# Patient Record
Sex: Female | Born: 1975
Health system: Southern US, Community
[De-identification: ages and names within clinical notes are randomized; demographics above are authoritative.]

## PROBLEM LIST (undated history)

## (undated) DIAGNOSIS — F329 Major depressive disorder, single episode, unspecified: Secondary | ICD-10-CM

## (undated) DIAGNOSIS — F419 Anxiety disorder, unspecified: Secondary | ICD-10-CM

## (undated) DIAGNOSIS — R000 Tachycardia, unspecified: Secondary | ICD-10-CM

## (undated) DIAGNOSIS — N200 Calculus of kidney: Secondary | ICD-10-CM

## (undated) DIAGNOSIS — F32A Depression, unspecified: Secondary | ICD-10-CM

## (undated) HISTORY — DX: Major depressive disorder, single episode, unspecified: F32.9

## (undated) HISTORY — DX: Depression, unspecified: F32.A

## (undated) HISTORY — DX: Anxiety disorder, unspecified: F41.9

## (undated) HISTORY — DX: Tachycardia, unspecified: R00.0

## (undated) HISTORY — DX: Calculus of kidney: N20.0

---

## 2018-02-15 DIAGNOSIS — R05 Cough: Secondary | ICD-10-CM | POA: Diagnosis not present

## 2018-02-15 DIAGNOSIS — J309 Allergic rhinitis, unspecified: Secondary | ICD-10-CM | POA: Diagnosis not present

## 2018-02-19 DIAGNOSIS — J029 Acute pharyngitis, unspecified: Secondary | ICD-10-CM | POA: Diagnosis not present

## 2018-06-07 DIAGNOSIS — Z9109 Other allergy status, other than to drugs and biological substances: Secondary | ICD-10-CM | POA: Diagnosis not present

## 2018-06-07 DIAGNOSIS — E7800 Pure hypercholesterolemia, unspecified: Secondary | ICD-10-CM | POA: Diagnosis not present

## 2018-06-07 DIAGNOSIS — E559 Vitamin D deficiency, unspecified: Secondary | ICD-10-CM | POA: Diagnosis not present

## 2018-06-07 DIAGNOSIS — R5383 Other fatigue: Secondary | ICD-10-CM | POA: Diagnosis not present

## 2018-06-07 DIAGNOSIS — R635 Abnormal weight gain: Secondary | ICD-10-CM | POA: Diagnosis not present

## 2018-07-04 DIAGNOSIS — L709 Acne, unspecified: Secondary | ICD-10-CM | POA: Diagnosis not present

## 2018-07-04 DIAGNOSIS — R635 Abnormal weight gain: Secondary | ICD-10-CM | POA: Diagnosis not present

## 2018-07-04 DIAGNOSIS — R5383 Other fatigue: Secondary | ICD-10-CM | POA: Diagnosis not present

## 2018-07-04 DIAGNOSIS — E7800 Pure hypercholesterolemia, unspecified: Secondary | ICD-10-CM | POA: Diagnosis not present

## 2018-08-11 DIAGNOSIS — F329 Major depressive disorder, single episode, unspecified: Secondary | ICD-10-CM | POA: Diagnosis not present

## 2018-08-11 DIAGNOSIS — Z23 Encounter for immunization: Secondary | ICD-10-CM | POA: Diagnosis not present

## 2018-08-11 DIAGNOSIS — R251 Tremor, unspecified: Secondary | ICD-10-CM | POA: Diagnosis not present

## 2018-08-28 ENCOUNTER — Other Ambulatory Visit: Payer: Self-pay | Admitting: Family Medicine

## 2018-08-28 DIAGNOSIS — Z1231 Encounter for screening mammogram for malignant neoplasm of breast: Secondary | ICD-10-CM

## 2018-09-12 DIAGNOSIS — R251 Tremor, unspecified: Secondary | ICD-10-CM | POA: Diagnosis not present

## 2018-09-12 DIAGNOSIS — F329 Major depressive disorder, single episode, unspecified: Secondary | ICD-10-CM | POA: Diagnosis not present

## 2018-09-20 DIAGNOSIS — F411 Generalized anxiety disorder: Secondary | ICD-10-CM | POA: Diagnosis not present

## 2018-09-27 ENCOUNTER — Ambulatory Visit
Admission: RE | Admit: 2018-09-27 | Discharge: 2018-09-27 | Disposition: A | Discharge status: Home / Self Care | Payer: BLUE CROSS/BLUE SHIELD | Source: Ambulatory Visit | Attending: Family Medicine | Admitting: Family Medicine

## 2018-09-27 DIAGNOSIS — Z1231 Encounter for screening mammogram for malignant neoplasm of breast: Secondary | ICD-10-CM

## 2018-09-28 ENCOUNTER — Other Ambulatory Visit: Payer: Self-pay | Admitting: Family Medicine

## 2018-09-28 DIAGNOSIS — N63 Unspecified lump in unspecified breast: Secondary | ICD-10-CM

## 2018-10-03 DIAGNOSIS — B349 Viral infection, unspecified: Secondary | ICD-10-CM | POA: Diagnosis not present

## 2018-10-03 DIAGNOSIS — J111 Influenza due to unidentified influenza virus with other respiratory manifestations: Secondary | ICD-10-CM | POA: Diagnosis not present

## 2018-10-03 DIAGNOSIS — R509 Fever, unspecified: Secondary | ICD-10-CM | POA: Diagnosis not present

## 2018-10-03 DIAGNOSIS — R6889 Other general symptoms and signs: Secondary | ICD-10-CM | POA: Diagnosis not present

## 2018-10-10 ENCOUNTER — Ambulatory Visit
Admission: RE | Admit: 2018-10-10 | Discharge: 2018-10-10 | Disposition: A | Discharge status: Home / Self Care | Payer: BLUE CROSS/BLUE SHIELD | Source: Ambulatory Visit | Attending: Family Medicine | Admitting: Family Medicine

## 2018-10-10 ENCOUNTER — Other Ambulatory Visit: Payer: Self-pay | Admitting: Family Medicine

## 2018-10-10 DIAGNOSIS — N63 Unspecified lump in unspecified breast: Secondary | ICD-10-CM

## 2018-10-10 DIAGNOSIS — R922 Inconclusive mammogram: Secondary | ICD-10-CM | POA: Diagnosis not present

## 2018-10-10 DIAGNOSIS — N6489 Other specified disorders of breast: Secondary | ICD-10-CM | POA: Diagnosis not present

## 2018-10-11 DIAGNOSIS — F411 Generalized anxiety disorder: Secondary | ICD-10-CM | POA: Diagnosis not present

## 2018-10-24 ENCOUNTER — Ambulatory Visit: Payer: BLUE CROSS/BLUE SHIELD | Admitting: Neurology

## 2019-02-08 DIAGNOSIS — Z0000 Encounter for general adult medical examination without abnormal findings: Secondary | ICD-10-CM | POA: Diagnosis not present

## 2019-03-01 ENCOUNTER — Other Ambulatory Visit: Payer: Self-pay

## 2019-03-01 ENCOUNTER — Emergency Department (HOSPITAL_COMMUNITY): Payer: BC Managed Care – PPO

## 2019-03-01 ENCOUNTER — Encounter (HOSPITAL_COMMUNITY): Payer: Self-pay | Admitting: Emergency Medicine

## 2019-03-01 ENCOUNTER — Emergency Department (HOSPITAL_COMMUNITY)
Admission: EM | Admit: 2019-03-01 | Discharge: 2019-03-01 | Disposition: A | Discharge status: Home / Self Care | Payer: BC Managed Care – PPO | Attending: Emergency Medicine | Admitting: Emergency Medicine

## 2019-03-01 DIAGNOSIS — Z79899 Other long term (current) drug therapy: Secondary | ICD-10-CM | POA: Diagnosis not present

## 2019-03-01 DIAGNOSIS — R55 Syncope and collapse: Secondary | ICD-10-CM | POA: Diagnosis not present

## 2019-03-01 DIAGNOSIS — Z20828 Contact with and (suspected) exposure to other viral communicable diseases: Secondary | ICD-10-CM | POA: Diagnosis not present

## 2019-03-01 DIAGNOSIS — R002 Palpitations: Secondary | ICD-10-CM | POA: Insufficient documentation

## 2019-03-01 DIAGNOSIS — R0602 Shortness of breath: Secondary | ICD-10-CM | POA: Diagnosis not present

## 2019-03-01 DIAGNOSIS — R000 Tachycardia, unspecified: Secondary | ICD-10-CM | POA: Diagnosis not present

## 2019-03-01 LAB — URINALYSIS, ROUTINE W REFLEX MICROSCOPIC
Bilirubin Urine: NEGATIVE
Glucose, UA: NEGATIVE mg/dL
Ketones, ur: NEGATIVE mg/dL
Nitrite: NEGATIVE
Protein, ur: 100 mg/dL — AB
Specific Gravity, Urine: 1.023 (ref 1.005–1.030)
pH: 6 (ref 5.0–8.0)

## 2019-03-01 LAB — BASIC METABOLIC PANEL
Anion gap: 11 (ref 5–15)
BUN: 12 mg/dL (ref 6–20)
CO2: 23 mmol/L (ref 22–32)
Calcium: 9 mg/dL (ref 8.9–10.3)
Chloride: 102 mmol/L (ref 98–111)
Creatinine, Ser: 0.72 mg/dL (ref 0.44–1.00)
GFR calc Af Amer: >60 mL/min (ref >60)
GFR calc non Af Amer: >60 mL/min (ref >60)
Glucose, Bld: 196 mg/dL — ABNORMAL HIGH (ref 70–99)
Potassium: 3.5 mmol/L (ref 3.5–5.1)
Sodium: 136 mmol/L (ref 135–145)

## 2019-03-01 LAB — CBC
HCT: 39.1 % (ref 36.0–46.0)
Hemoglobin: 13 g/dL (ref 12.0–15.0)
MCH: 29.7 pg (ref 26.0–34.0)
MCHC: 33.2 g/dL (ref 30.0–36.0)
MCV: 89.5 fL (ref 80.0–100.0)
Platelets: 261 10*3/uL (ref 150–400)
RBC: 4.37 MIL/uL (ref 3.87–5.11)
RDW: 13.1 % (ref 11.5–15.5)
WBC: 10.6 10*3/uL — ABNORMAL HIGH (ref 4.0–10.5)
nRBC: 0 % (ref 0.0–0.2)

## 2019-03-01 LAB — I-STAT BETA HCG BLOOD, ED (MC, WL, AP ONLY): I-stat hCG, quantitative: <5 m[IU]/mL (ref <5)

## 2019-03-01 LAB — CBG MONITORING, ED: Glucose-Capillary: 139 mg/dL — ABNORMAL HIGH (ref 70–99)

## 2019-03-01 LAB — TSH: TSH: 8.58 u[IU]/mL — ABNORMAL HIGH (ref 0.350–4.500)

## 2019-03-01 LAB — D-DIMER, QUANTITATIVE: D-Dimer, Quant: 0.68 ug/mL-FEU — ABNORMAL HIGH (ref 0.00–0.50)

## 2019-03-01 MED ORDER — ALPRAZOLAM 0.25 MG PO TABS
1.0000 mg | ORAL_TABLET | Freq: Once | ORAL | Status: AC
  Administered 2019-03-01: 1 mg via ORAL
  Filled 2019-03-01: qty 4

## 2019-03-01 MED ORDER — SODIUM CHLORIDE 0.9 % IV BOLUS
1000.0000 mL | Freq: Once | INTRAVENOUS | Status: AC
  Administered 2019-03-01: 06:00:00 1000 mL via INTRAVENOUS

## 2019-03-01 MED ORDER — SODIUM CHLORIDE 0.9% FLUSH
3.0000 mL | Freq: Once | INTRAVENOUS | Status: AC
  Administered 2019-03-01: 3 mL via INTRAVENOUS

## 2019-03-01 MED ORDER — IOHEXOL 350 MG/ML SOLN
80.0000 mL | Freq: Once | INTRAVENOUS | Status: AC | PRN
  Administered 2019-03-01: 80 mL via INTRAVENOUS

## 2019-03-01 MED ORDER — LORAZEPAM 0.5 MG PO TABS: 0.5000 mg | ORAL_TABLET | Freq: Three times a day (TID) | ORAL | 0 refills | Status: DC | PRN

## 2019-03-01 NOTE — ED Notes (Signed)
DG bedside

## 2019-03-01 NOTE — Discharge Instructions (Signed)
Please return to the ED if symptoms worsen, there is further passing out or for new concern.

## 2019-03-01 NOTE — ED Notes (Signed)
Patient transported to CT 

## 2019-03-01 NOTE — ED Triage Notes (Signed)
Pt reports that she feels palpations tonight and reports that her husband witnessed two separate occasions of "passing out".  Pt reports chills and feeling "different."

## 2019-03-01 NOTE — ED Provider Notes (Signed)
[place] EMERGENCY DEPARTMENT Provider Note   CSN: [id number] Arrival date & time: 03/01/19  0235     History   Chief Complaint Chief Complaint  Patient presents with  . Loss of Consciousness    HPI Jeanne Fitzpatrick is a 43 y.o. female.     Patient to ED with complaint of palpitations that started one week ago and have progressively gotten worse. No chest pain, fever or cough. Episodes started as lasting minutes and now last for hours. She feels like she has to make herself take deep breaths when her heart is racing. No nausea or vomiting. Today, she had 2 syncopal episodes associated with the palpitations. No significant weight loss, hot/cold intolerance, skin changes. No pain and/or swelling of LE's. No history of recent travel or clotting disorders.   The history is provided by the patient. No language interpreter was used.  Loss of Consciousness Associated symptoms: palpitations and shortness of breath   Associated symptoms: no chest pain, no diaphoresis, no fever, no headaches, no nausea, no vomiting and no weakness     Past Medical History:  Diagnosis Date  . Depression     There are no active problems to display for this patient.   Past Surgical History:  Procedure Laterality Date  . CESAREAN SECTION       OB History   No obstetric history on file.      Home Medications    Prior to Admission medications   Medication Sig Start Date End Date Taking? Authorizing Provider  buPROPion (WELLBUTRIN XL) 150 MG 24 hr tablet Take 150 mg by mouth daily.    [provider]    Family History No family history on file.  Social History Social History   Tobacco Use  . Smoking status: Not on file  Substance Use Topics  . Alcohol use: Not on file  . Drug use: Not on file     Allergies   Patient has no allergy information on record.   Review of Systems Review of Systems  Constitutional: Negative for chills, diaphoresis, fever and  unexpected weight change.  HENT: Negative.   Respiratory: Positive for chest tightness and shortness of breath. Negative for cough.   Cardiovascular: Positive for palpitations and syncope. Negative for chest pain and leg swelling.  Gastrointestinal: Negative.  Negative for abdominal pain, nausea and vomiting.  Musculoskeletal: Negative.  Negative for myalgias.  Skin: Negative.   Neurological: Positive for syncope. Negative for weakness, numbness and headaches.     Physical Exam Updated Vital Signs BP (!) 144/84   Pulse (!) 121   Temp 97.6 F (36.4 C) (Oral)   Resp 17   SpO2 100%   Physical Exam Vitals signs and nursing note reviewed.  Constitutional:      Appearance: She is well-developed. She is not ill-appearing or diaphoretic.     Comments: Anxious appearing.   HENT:     Head: Normocephalic.     Mouth/Throat:     Mouth: Mucous membranes are moist.  Eyes:     Pupils: Pupils are equal, round, and reactive to light.  Neck:     Musculoskeletal: Normal range of motion and neck supple.  Cardiovascular:     Rate and Rhythm: Regular rhythm. Tachycardia present.     Heart sounds: No murmur.  Pulmonary:     Effort: Pulmonary effort is normal.     Breath sounds: Normal breath sounds. No wheezing, rhonchi or rales.  Chest:     Chest  wall: No tenderness.  Abdominal:     General: Bowel sounds are normal.     Palpations: Abdomen is soft.     Tenderness: There is no abdominal tenderness. There is no guarding or rebound.  Musculoskeletal: Normal range of motion.        General: No swelling or tenderness.  Skin:    General: Skin is warm and dry.  Neurological:     General: No focal deficit present.     Mental Status: She is alert and oriented to person, place, and time.      ED Treatments / Results  Labs (all labs ordered are listed, but only abnormal results are displayed) Labs Reviewed  BASIC METABOLIC PANEL - Abnormal; Notable for the following components:      Result  Value   Glucose, Bld 196 (*)    All other components within normal limits  CBC - Abnormal; Notable for the following components:   WBC 10.6 (*)    All other components within normal limits  URINALYSIS, ROUTINE W REFLEX MICROSCOPIC - Abnormal; Notable for the following components:   Color, Urine AMBER (*)    APPearance CLOUDY (*)    Hgb urine dipstick LARGE (*)    Protein, ur 100 (*)    Leukocytes,Ua TRACE (*)    Bacteria, UA RARE (*)    All other components within normal limits  D-DIMER, QUANTITATIVE (NOT AT [facility]) - Abnormal; Notable for the following components:   D-Dimer, Quant 0.68 (*)    All other components within normal limits  CBG MONITORING, ED - Abnormal; Notable for the following components:   Glucose-Capillary 139 (*)    All other components within normal limits  TSH  I-STAT BETA HCG BLOOD, ED ([facility], [facility], [facility] ONLY)    EKG EKG Interpretation  Date/Time:  Thursday March 01 2019 02:49:49 EDT Ventricular Rate:  113 PR Interval:  176 QRS Duration: 72 QT Interval:  298 QTC Calculation: 408 R Axis:   72 Text Interpretation:  Sinus tachycardia Right atrial enlargement T wave abnormality, consider inferior ischemia Abnormal ECG No old tracing to compare suspect long QT Confirmed by Marily MemosMesner, Jason [phone]([id number]) on 03/01/2019 3:34:50 AM   Radiology Dg Chest Portable 1 View  Result Date: 03/01/2019 CLINICAL DATA:  Shortness of breath EXAM: PORTABLE CHEST 1 VIEW COMPARISON:  None. FINDINGS: Heart and mediastinal contours are within normal limits. No focal opacities or effusions. No acute bony abnormality. IMPRESSION: No active disease. Electronically Signed   By: Charlett NoseKevin  Dover M.D.   On: 03/01/2019 04:00    Procedures Procedures (including critical care time)  Medications Ordered in ED Medications  sodium chloride flush (NS) 0.9 % injection 3 mL (has no administration in time range)  ALPRAZolam (XANAX) tablet 1 mg (1 mg Oral Given 03/01/19 0414)     Initial Impression / Assessment  and Plan / ED Course  I have reviewed the triage vital signs and the nursing notes.  Pertinent labs & imaging results that were available during my care of the patient were reviewed by me and considered in my medical decision making (see chart for details).        Patient to ED having progressively worsening 'heart racing' palpitations x 1 week without previous history. No fever, cough, vomiting, chest pain. She reports 2 episodes of syncope today.   Patient's heartrate persistently elevated to 130's. EKG shows sinus tachycardia. Labs are essentially WNL. Mildly elevated TSH which does not c/w tachycardia. Elevated d-dimer with CTA negative for PE.  DDx includes anxiety, however, episodes are increasing in duration and include syncope. Cardiogenic cause will need to be evaluated - will provide referral to cardiology.   IVF's provided. There is some improvement in heart rate to 108. She is feeling better. Has had Xanax here so will provide Rx for Ativan at home #8 only.   The patient has expressed concerns for COVID-19 as a possible cause of her symptoms. She reports a positive contact exposed to husband. Will obtain send out test to follow up outpatient.   Return precautions discussed. She states she is comfortable going home and is felt reliable to outpatient follow up.     Final Clinical Impressions(s) / ED Diagnoses   Final diagnoses:  None   1. Palpitations.  ED Discharge Orders    None       Elpidio AnisUpstill, Nixon Sparr, Cordelia Poche-C 03/01/19 [id number]    Mesner, Barbara CowerJason, MD 03/01/19 [phone]

## 2019-03-02 LAB — NOVEL CORONAVIRUS, NAA (HOSP ORDER, SEND-OUT TO REF LAB; TAT 18-24 HRS): SARS-CoV-2, NAA: NOT DETECTED

## 2019-03-05 DIAGNOSIS — F411 Generalized anxiety disorder: Secondary | ICD-10-CM | POA: Diagnosis not present

## 2019-03-05 DIAGNOSIS — R002 Palpitations: Secondary | ICD-10-CM | POA: Diagnosis not present

## 2019-03-05 DIAGNOSIS — R55 Syncope and collapse: Secondary | ICD-10-CM | POA: Diagnosis not present

## 2019-03-05 DIAGNOSIS — R946 Abnormal results of thyroid function studies: Secondary | ICD-10-CM | POA: Diagnosis not present

## 2019-03-06 ENCOUNTER — Encounter: Payer: Self-pay | Admitting: Cardiology

## 2019-03-06 ENCOUNTER — Ambulatory Visit (INDEPENDENT_AMBULATORY_CARE_PROVIDER_SITE_OTHER): Payer: BC Managed Care – PPO | Admitting: Cardiology

## 2019-03-06 ENCOUNTER — Other Ambulatory Visit: Payer: Self-pay

## 2019-03-06 VITALS — BP 130/70 | HR 114 | Ht 63.0 in | Wt 140.4 lb

## 2019-03-06 DIAGNOSIS — R002 Palpitations: Secondary | ICD-10-CM

## 2019-03-06 DIAGNOSIS — R55 Syncope and collapse: Secondary | ICD-10-CM

## 2019-03-06 DIAGNOSIS — R0602 Shortness of breath: Secondary | ICD-10-CM | POA: Diagnosis not present

## 2019-03-06 HISTORY — DX: Palpitations: R00.2

## 2019-03-06 HISTORY — DX: Shortness of breath: R06.02

## 2019-03-06 HISTORY — DX: Syncope and collapse: R55

## 2019-03-06 NOTE — Progress Notes (Signed)
Cardiology Office Note:    Date:  03/06/2019   ID:  Jeanne Fitzpatrick, DOB 11-12-1975, MRN [id number]  PCP:  Farris HasMorrow, Aaron, MD  Cardiologist:  No primary care provider on file.  Electrophysiologist:  None   Referring MD: Farris HasMorrow, Aaron, MD   Chief Complaint  Patient presents with  . Hospitalization Follow-up  43 yo female presents to establish cardiac care and follow up after recent ED visit for palpitations and syncopal episodes.   History of Present Illness:    Jeanne Fitzpatrick is a 43 y.o. female with a hx of depression who presents today for follow up after being [facility] 03[facility]01/[facility] for palpitations, syncope, chest tightness, and shortness of breath. During her ED visit, CTA that was negative for PE after d-dimer of 0.68, negative COVID test, normal chest x-ray. Noted TSH of 8.58. She was given IVF with some improvement in tachycardia. EKG with sinus tachycardia without acute ST/T wave changes. She was discharged with a prescription for 6 tablets of Ativan 0.5mg  as Xanax in the ED helped her symptoms.   Tells me her palpitations started about a week ago and she has had 4-5 episodes.  The episode that took her to the emergency department occurred suddenly in the middle of the night and she reports that her heart just "did not feel quite right".  She went to the restroom feeling that she needed to have a bowel movement and got very sweaty and passed out twice per her husband's report.  She said the first time she fell to the floor from a sitting position and the second time she merely "slumped over".  She has had no episodes of syncope since.  Tells me she was passed out for only a couple of seconds.  Palpitations are associated with shortness of breath.  She was given a prescription for Ativan in the emergency department, tells me she took it once before bed and felt that she slept better, but was not sure why she was given it.  She reports no excessive caffeine use, was drinking 2 Cokes  per day up until about 2 months ago when she started watching her diet and exercising regularly.  She was using the elliptical 30 minutes a day 4-5 times per week.  Since these episodes have started she has been hesitant to get back on the elliptical.  She takes no over-the-counter proarrhythmic medications.    Reports no family history of sudden cardiac death.  Tells me her father has arrhythmia and has had some sort of heart surgery.  Tells me her paternal grandparents both had MI and her paternal grandfather died while undergoing heart surgery.  All of these procedures were in [place] where she is from and she is not sure of the names of the procedures in [place].  She currently resides with her husband and daughter.  She denies chest pain, edema.  Past Medical History:  Diagnosis Date  . Depression     Past Surgical History:  Procedure Laterality Date  . CESAREAN SECTION      Current Medications: Current Meds  Medication Sig  . cholecalciferol (VITAMIN D3) 25 MCG (1000 UT) tablet Take 2,000 Units by mouth daily.  Marland Kitchen. escitalopram (LEXAPRO) 5 MG tablet Take 5 mg by mouth daily.  Marland Kitchen. LORazepam (ATIVAN) 0.5 MG tablet Take 1 tablet (0.5 mg total) by mouth every 8 (eight) hours as needed for anxiety.  . vitamin C (ASCORBIC ACID) 500 MG tablet Take 1,000 mg by mouth  daily.     Allergies:   Patient has no known allergies.   Social History   Socioeconomic History  . Marital status: Married    Spouse name: Not on file  . Number of children: Not on file  . Years of education: Not on file  . Highest education level: Not on file  Occupational History  . Not on file  Social Needs  . Financial resource strain: Not on file  . Food insecurity    Worry: Not on file    Inability: Not on file  . Transportation needs    Medical: Not on file    Non-medical: Not on file  Tobacco Use  . Smoking status: Never Smoker  . Smokeless tobacco: Never Used  Substance and Sexual Activity  . Alcohol  use: Never    Frequency: Never  . Drug use: Never  . Sexual activity: Not on file  Lifestyle  . Physical activity    Days per week: Not on file    Minutes per session: Not on file  . Stress: Not on file  Relationships  . Social Musicianconnections    Talks on phone: Not on file    Gets together: Not on file    Attends religious service: Not on file    Active member of club or organization: Not on file    Attends meetings of clubs or organizations: Not on file    Relationship status: Not on file  Other Topics Concern  . Not on file  Social History Narrative  . Not on file     Family History: The patient's father with arrhythmia and reported heart surgery.  Paternal grandfather with report of MI and death during cardiac surgery.  Paternal grandmother with report of MI.  ROS:   Please see the history of present illness.    Review of Systems  Constitution: Positive for diaphoresis (during syncopal episode). Negative for chills, fever and malaise/fatigue.  Cardiovascular: Positive for irregular heartbeat, palpitations and syncope (episodes prior to ED visit, none since). Negative for chest pain, dyspnea on exertion and leg swelling.  Respiratory: Positive for shortness of breath (with palpitations). Negative for cough and wheezing.   Gastrointestinal: Negative for nausea and vomiting.  Neurological: Negative for dizziness, light-headedness and weakness.    All other systems reviewed and are negative.  EKGs/Labs/Other Studies Reviewed:    The following studies were reviewed today: EKG from emergency department shows sinus tachycardia.  EKG:  EKG is ordered today.  The ekg ordered today demonstrates normal sinus rhythm with possible left atrial enlargement.  No acute ST/T wave changes.  Recent Labs: 03/01/2019: BUN 12; Creatinine, Ser 0.72; Hemoglobin 13.0; Platelets 261; Potassium 3.5; Sodium 136; TSH 8.580  Recent Lipid Panel No results found for: CHOL, TRIG, HDL, CHOLHDL, VLDL,  LDLCALC, LDLDIRECT  Physical Exam:    VS:  BP 130/70   Pulse (!) 114   Ht 5\' 3"  (1.6 m)   Wt 140 lb 6.4 oz (63.7 kg)   SpO2 99%   BMI 24.87 kg/m     Wt Readings from Last 3 Encounters:  03/06/19 140 lb 6.4 oz (63.7 kg)     GEN:  Well nourished, well developed in no acute distress HEENT: Normal NECK: No JVD; No carotid bruits LYMPHATICS: No lymphadenopathy CARDIAC: RRR, no murmurs, rubs, gallops RESPIRATORY:  Clear to auscultation without rales, wheezing or rhonchi  ABDOMEN: Soft, non-tender, non-distended MUSCULOSKELETAL:  No edema; No deformity  SKIN: Warm and dry NEUROLOGIC:  Alert and  oriented x 3 PSYCHIATRIC:  Normal affect   ASSESSMENT:    1. Palpitations   2. Syncope and collapse   3. Shortness of breath    PLAN:    In order of problems listed above:  1. Palpitations - Onset 1 week ago.  Reports her heart "does not feel right".  Seen in ED for episode - EKG at that time with tachycardia - treated with IVF with some improvement. No obvious cause: no excessive caffeine use, non-smoker, no over-the-counter proarrhythmic medications.  Monitors her heart rate by her Apple watch and reports heart rates regularly average in the 80s.  Can utilize EKG function on Apple watch.  Etiology consideration of inappropriate sinus tachycardia, QTC prolongation, anxiety.   TSH was abnormal (8.58) in the emergency department, opposite of what expected in the setting of tachycardia. .  Will repeat TSH with free T3-T4.  Will order 14 ZIO monitor. 2. Syncope -No recurrence since visit to the ED.  2 episodes prior to the emergency department lasted seconds and were consistent with vasovagal syncope as she was on the toilet having a bowel movement and sweating.  She is advised to remain adequately hydrated. 3. SOB -Associated with episodes of palpitations.  Does not occur at any other time.  Denies edema.  Obtain echocardiogram.  Medication Adjustments/Labs and Tests Ordered: Current  medicines are reviewed at length with the patient today.  Concerns regarding medicines are outlined above.  Orders Placed This Encounter  Procedures  . TSH  . T3, free  . T4, free  . LONG TERM MONITOR (3-14 DAYS)  . EKG 12-Lead  . ECHOCARDIOGRAM COMPLETE   No orders of the defined types were placed in this encounter.   Patient Instructions  Medication Instructions:  No medication changes today  If you need a refill on your cardiac medications before your next appointment, please call your pharmacy.   Lab work: TSH, Free T3, T4 for assessment of your thyroid as your numbers in the emergency department were abnormal  If you have labs (blood work) drawn today and your tests are completely normal, you will receive your results only by: Marland Kitchen. MyChart Message (if you have MyChart) OR . A paper copy in the mail If you have any lab test that is abnormal or we need to change your treatment, we will call you to review the results.  Testing/Procedures: Echocardiogram (ultrasound of heart) 14 day ZIO (heart monitor) will be mailed to your home  Follow-Up: At [facility], you and your health needs are our priority.  As part of our continuing mission to provide you with exceptional heart care, we have created designated Provider Care Teams.  These Care Teams include your primary Cardiologist (physician) and Advanced Practice Providers (APPs -  Physician Assistants and Nurse Practitioners) who all work together to provide you with the care you need, when you need it. . You will need a follow up appointment in 4 weeks with Dr. Bing MatterKrasowski    Any Other Special Instructions Will Be Listed Below (If Applicable).  Your syncope sounds like vasovagal syncope. Try to stay well hydrated.   Echocardiogram An echocardiogram is a procedure that uses painless sound waves (ultrasound) to produce an image of the heart. Images from an echocardiogram can provide important information about:  Signs of  coronary artery disease (CAD).  Aneurysm detection. An aneurysm is a weak or damaged part of an artery wall that bulges out from the normal force of blood pumping through the body.  Heart size and shape. Changes in the size or shape of the heart can be associated with certain conditions, including heart failure, aneurysm, and CAD.  Heart muscle function.  Heart valve function.  Signs of a past heart attack.  Fluid buildup around the heart.  Thickening of the heart muscle.  A tumor or infectious growth around the heart valves. Tell a health care provider about:  Any allergies you have.  All medicines you are taking, including vitamins, herbs, eye drops, creams, and over-the-counter medicines.  Any blood disorders you have.  Any surgeries you have had.  Any medical conditions you have.  Whether you are pregnant or may be pregnant. What are the risks? Generally, this is a safe procedure. However, problems may occur, including:  Allergic reaction to dye (contrast) that may be used during the procedure. What happens before the procedure? No specific preparation is needed. You may eat and drink normally. What happens during the procedure?   An IV tube may be inserted into one of your veins.  You may receive contrast through this tube. A contrast is an injection that improves the quality of the pictures from your heart.  A gel will be applied to your chest.  A wand-like tool (transducer) will be moved over your chest. The gel will help to transmit the sound waves from the transducer.  The sound waves will harmlessly bounce off of your heart to allow the heart images to be captured in real-time motion. The images will be recorded on a computer. The procedure may vary among health care providers and hospitals. What happens after the procedure?  You may return to your normal, everyday life, including diet, activities, and medicines, unless your health care provider tells you  not to do that. Summary  An echocardiogram is a procedure that uses painless sound waves (ultrasound) to produce an image of the heart.  Images from an echocardiogram can provide important information about the size and shape of your heart, heart muscle function, heart valve function, and fluid buildup around your heart.  You do not need to do anything to prepare before this procedure. You may eat and drink normally.  After the echocardiogram is completed, you may return to your normal, everyday life, unless your health care provider tells you not to do that. This information is not intended to replace advice given to you by your health care provider. Make sure you discuss any questions you have with your health care provider. Document Released: 08/13/2000 Document Revised: 12/07/2018 Document Reviewed: 09/18/2016 Elsevier Patient Education  2020 [place], Loel Dubonnet, NP  03/06/2019 2:35 PM    [place]

## 2019-03-06 NOTE — Patient Instructions (Addendum)
Medication Instructions:  No medication changes today  If you need a refill on your cardiac medications before your next appointment, please call your pharmacy.   Lab work: TSH, Free T3, T4 for assessment of your thyroid as your numbers in the emergency department were abnormal  If you have labs (blood work) drawn today and your tests are completely normal, you will receive your results only by: Marland Kitchen. MyChart Message (if you have MyChart) OR . A paper copy in the mail If you have any lab test that is abnormal or we need to change your treatment, we will call you to review the results.  Testing/Procedures: Echocardiogram (ultrasound of heart) 14 day ZIO (heart monitor) will be mailed to your home  Follow-Up: At [facility], you and your health needs are our priority.  As part of our continuing mission to provide you with exceptional heart care, we have created designated Provider Care Teams.  These Care Teams include your primary Cardiologist (physician) and Advanced Practice Providers (APPs -  Physician Assistants and Nurse Practitioners) who all work together to provide you with the care you need, when you need it. . You will need a follow up appointment in 4 weeks with Dr. Bing MatterKrasowski    Any Other Special Instructions Will Be Listed Below (If Applicable).  Your syncope sounds like vasovagal syncope. Try to stay well hydrated.   Echocardiogram An echocardiogram is a procedure that uses painless sound waves (ultrasound) to produce an image of the heart. Images from an echocardiogram can provide important information about:  Signs of coronary artery disease (CAD).  Aneurysm detection. An aneurysm is a weak or damaged part of an artery wall that bulges out from the normal force of blood pumping through the body.  Heart size and shape. Changes in the size or shape of the heart can be associated with certain conditions, including heart failure, aneurysm, and CAD.  Heart muscle  function.  Heart valve function.  Signs of a past heart attack.  Fluid buildup around the heart.  Thickening of the heart muscle.  A tumor or infectious growth around the heart valves. Tell a health care provider about:  Any allergies you have.  All medicines you are taking, including vitamins, herbs, eye drops, creams, and over-the-counter medicines.  Any blood disorders you have.  Any surgeries you have had.  Any medical conditions you have.  Whether you are pregnant or may be pregnant. What are the risks? Generally, this is a safe procedure. However, problems may occur, including:  Allergic reaction to dye (contrast) that may be used during the procedure. What happens before the procedure? No specific preparation is needed. You may eat and drink normally. What happens during the procedure?   An IV tube may be inserted into one of your veins.  You may receive contrast through this tube. A contrast is an injection that improves the quality of the pictures from your heart.  A gel will be applied to your chest.  A wand-like tool (transducer) will be moved over your chest. The gel will help to transmit the sound waves from the transducer.  The sound waves will harmlessly bounce off of your heart to allow the heart images to be captured in real-time motion. The images will be recorded on a computer. The procedure may vary among health care providers and hospitals. What happens after the procedure?  You may return to your normal, everyday life, including diet, activities, and medicines, unless your health care provider tells you  not to do that. Summary  An echocardiogram is a procedure that uses painless sound waves (ultrasound) to produce an image of the heart.  Images from an echocardiogram can provide important information about the size and shape of your heart, heart muscle function, heart valve function, and fluid buildup around your heart.  You do not need to do  anything to prepare before this procedure. You may eat and drink normally.  After the echocardiogram is completed, you may return to your normal, everyday life, unless your health care provider tells you not to do that. This information is not intended to replace advice given to you by your health care provider. Make sure you discuss any questions you have with your health care provider. Document Released: 08/13/2000 Document Revised: 12/07/2018 Document Reviewed: 09/18/2016 Elsevier Patient Education  2020 [employer].

## 2019-03-07 LAB — TSH: TSH: 1.61 u[IU]/mL (ref 0.450–4.500)

## 2019-03-07 LAB — T3, FREE: T3, Free: 3 pg/mL (ref 2.0–4.4)

## 2019-03-07 LAB — T4, FREE: Free T4: 1.15 ng/dL (ref 0.82–1.77)

## 2019-03-15 ENCOUNTER — Ambulatory Visit (HOSPITAL_BASED_OUTPATIENT_CLINIC_OR_DEPARTMENT_OTHER)
Admission: RE | Admit: 2019-03-15 | Discharge: 2019-03-15 | Disposition: A | Discharge status: Home / Self Care | Payer: BC Managed Care – PPO | Source: Ambulatory Visit | Attending: Cardiology | Admitting: Cardiology

## 2019-03-15 ENCOUNTER — Other Ambulatory Visit: Payer: Self-pay

## 2019-03-15 DIAGNOSIS — R002 Palpitations: Secondary | ICD-10-CM | POA: Diagnosis not present

## 2019-03-15 DIAGNOSIS — R55 Syncope and collapse: Secondary | ICD-10-CM | POA: Diagnosis not present

## 2019-03-15 DIAGNOSIS — R0602 Shortness of breath: Secondary | ICD-10-CM | POA: Diagnosis not present

## 2019-03-15 NOTE — Progress Notes (Signed)
  Echocardiogram 2D Echocardiogram has been performed.  Jeanne Fitzpatrick 03/15/2019, 9:16 AM

## 2019-03-20 ENCOUNTER — Other Ambulatory Visit (INDEPENDENT_AMBULATORY_CARE_PROVIDER_SITE_OTHER): Payer: BC Managed Care – PPO

## 2019-03-20 DIAGNOSIS — R002 Palpitations: Secondary | ICD-10-CM

## 2019-03-20 DIAGNOSIS — Z0000 Encounter for general adult medical examination without abnormal findings: Secondary | ICD-10-CM | POA: Diagnosis not present

## 2019-03-20 DIAGNOSIS — E559 Vitamin D deficiency, unspecified: Secondary | ICD-10-CM | POA: Diagnosis not present

## 2019-03-20 DIAGNOSIS — R946 Abnormal results of thyroid function studies: Secondary | ICD-10-CM | POA: Diagnosis not present

## 2019-03-20 DIAGNOSIS — R55 Syncope and collapse: Secondary | ICD-10-CM

## 2019-03-20 DIAGNOSIS — Z8249 Family history of ischemic heart disease and other diseases of the circulatory system: Secondary | ICD-10-CM | POA: Diagnosis not present

## 2019-04-03 ENCOUNTER — Ambulatory Visit: Payer: BC Managed Care – PPO | Admitting: Cardiology

## 2019-04-17 ENCOUNTER — Ambulatory Visit (INDEPENDENT_AMBULATORY_CARE_PROVIDER_SITE_OTHER): Payer: BC Managed Care – PPO | Admitting: Cardiology

## 2019-04-17 ENCOUNTER — Encounter: Payer: Self-pay | Admitting: Cardiology

## 2019-04-17 ENCOUNTER — Other Ambulatory Visit: Payer: Self-pay

## 2019-04-17 VITALS — BP 118/84 | HR 97 | Ht 63.0 in | Wt 144.0 lb

## 2019-04-17 DIAGNOSIS — R002 Palpitations: Secondary | ICD-10-CM

## 2019-04-17 DIAGNOSIS — R0602 Shortness of breath: Secondary | ICD-10-CM

## 2019-04-17 DIAGNOSIS — R55 Syncope and collapse: Secondary | ICD-10-CM

## 2019-04-17 MED ORDER — METOPROLOL SUCCINATE ER 25 MG PO TB24: 25.0000 mg | ORAL_TABLET | ORAL | 0 refills | Status: DC | PRN

## 2019-04-17 NOTE — Progress Notes (Signed)
Cardiology Office Note:    Date:  04/17/2019   ID:  Jeanne Fitzpatrick Maudlin, DOB 1975-11-28, MRN [id number]  PCP:  Farris HasMorrow, Aaron, MD  Cardiologist:  Gypsy Balsamobert Krasowski, MD    Referring MD: Farris HasMorrow, Aaron, MD   Chief Complaint  Patient presents with  . Follow-up    zio results  Doing much better  History of Present Illness:    Jeanne Fitzpatrick Knutzen is a 43 y.o. female she was referred to us because of palpitations as well as episode of syncope.  Syncope look like vasovagal.  She was asked to drink plenty of fluids and liberate her salt intake.  Since that time she is doing well.  Denies having any episode of dizziness or syncope.  Another concern was palpitations.  She wore monitor which showed some sinus tachycardia that she pressed trigger for, she also got some narrow complex tachycardia but not much.  She is here today to talk about this overall she is feeling much better.  She did have echocardiogram which showed preserved left ventricular ejection fraction we discussed this as well.  I offered her a small dose of beta-blocker to use on as-needed basis she accepted that offer.  Past Medical History:  Diagnosis Date  . Depression     Past Surgical History:  Procedure Laterality Date  . CESAREAN SECTION      Current Medications: Current Meds  Medication Sig  . cholecalciferol (VITAMIN D3) 25 MCG (1000 UT) tablet Take 2,000 Units by mouth daily.  Marland Kitchen. escitalopram (LEXAPRO) 5 MG tablet Take 5 mg by mouth daily.  . vitamin C (ASCORBIC ACID) 500 MG tablet Take 1,000 mg by mouth daily.     Allergies:   Patient has no known allergies.   Social History   Socioeconomic History  . Marital status: Married    Spouse name: Not on file  . Number of children: Not on file  . Years of education: Not on file  . Highest education level: Not on file  Occupational History  . Not on file  Social Needs  . Financial resource strain: Not on file  . Food insecurity    Worry: Not on file    Inability: Not on  file  . Transportation needs    Medical: Not on file    Non-medical: Not on file  Tobacco Use  . Smoking status: Never Smoker  . Smokeless tobacco: Never Used  Substance and Sexual Activity  . Alcohol use: Never    Frequency: Never  . Drug use: Never  . Sexual activity: Not on file  Lifestyle  . Physical activity    Days per week: Not on file    Minutes per session: Not on file  . Stress: Not on file  Relationships  . Social Musicianconnections    Talks on phone: Not on file    Gets together: Not on file    Attends religious service: Not on file    Active member of club or organization: Not on file    Attends meetings of clubs or organizations: Not on file    Relationship status: Not on file  Other Topics Concern  . Not on file  Social History Narrative  . Not on file     Family History: The patient's family history is not on file. ROS:   Please see the history of present illness.    All 14 point review of systems negative except as described per history of present illness  EKGs/Labs/Other Studies Reviewed:  Recent Labs: 03/01/2019: BUN 12; Creatinine, Ser 0.72; Hemoglobin 13.0; Platelets 261; Potassium 3.5; Sodium 136 03/06/2019: TSH 1.610  Recent Lipid Panel No results found for: CHOL, TRIG, HDL, CHOLHDL, VLDL, LDLCALC, LDLDIRECT  Physical Exam:    VS:  BP 118/84 (BP Location: Right Arm, Patient Position: Sitting)   Pulse 97   Ht 5\' 3"  (1.6 m)   Wt 144 lb (65.3 kg)   SpO2 98%   BMI 25.51 kg/m     Wt Readings from Last 3 Encounters:  04/17/19 144 lb (65.3 kg)  03/06/19 140 lb 6.4 oz (63.7 kg)     GEN:  Well nourished, well developed in no acute distress HEENT: Normal NECK: No JVD; No carotid bruits LYMPHATICS: No lymphadenopathy CARDIAC: RRR, no murmurs, no rubs, no gallops RESPIRATORY:  Clear to auscultation without rales, wheezing or rhonchi  ABDOMEN: Soft, non-tender, non-distended MUSCULOSKELETAL:  No edema; No deformity  SKIN: Warm and dry LOWER  EXTREMITIES: no swelling NEUROLOGIC:  Alert and oriented x 3 PSYCHIATRIC:  Normal affect   ASSESSMENT:    1. Syncope and collapse   2. Shortness of breath   3. Palpitations    PLAN:    In order of problems listed above:  1. Syncope with collapse denies having any look like vasovagal.  Monitor did not show any worrisome arrhythmia.  Echocardiogram showed preserved ejection fraction.  We will continue present management we will give her prescription for 25 mg metoprolol succinate. 2. Shortness of breath normal left ventricular ejection fraction by echocardiogram.  Continue present management. 3. Palpitations denies having any.  Will use beta-blocker to help with her symptoms.      Medication Adjustments/Labs and Tests Ordered: Current medicines are reviewed at length with the patient today.  Concerns regarding medicines are outlined above.  No orders of the defined types were placed in this encounter.  Medication changes: No orders of the defined types were placed in this encounter.   Signed, Park Liter, MD, [facility] 04/17/2019 10:37 AM    [place]

## 2019-04-17 NOTE — Patient Instructions (Signed)
Medication Instructions:  Your physician has recommended you make the following change in your medication:   Take: Metoprolol succinate 25 mg as needed for palpitations.   If you need a refill on your cardiac medications before your next appointment, please call your pharmacy.   Lab work: None.  If you have labs (blood work) drawn today and your tests are completely normal, you will receive your results only by: Marland Kitchen MyChart Message (if you have MyChart) OR . A paper copy in the mail If you have any lab test that is abnormal or we need to change your treatment, we will call you to review the results.  Testing/Procedures: None.   Follow-Up: At [facility], you and your health needs are our priority.  As part of our continuing mission to provide you with exceptional heart care, we have created designated Provider Care Teams.  These Care Teams include your primary Cardiologist (physician) and Advanced Practice Providers (APPs -  Physician Assistants and Nurse Practitioners) who all work together to provide you with the care you need, when you need it. You will need a follow up appointment in 3 months.  Please call our office 2 months in advance to schedule this appointment.  You may see No primary care provider on file. or another member of our [employer] Provider Team in [place]: Shirlee More, MD . Jyl Heinz, MD  Any Other Special Instructions Will Be Listed Below (If Applicable).  Metoprolol extended-release tablets What is this medicine? METOPROLOL (me TOE proe lole) is a beta-blocker. Beta-blockers reduce the workload on the heart and help it to beat more regularly. This medicine is used to treat high blood pressure and to prevent chest pain. It is also used to after a heart attack and to prevent an additional heart attack from occurring. This medicine may be used for other purposes; ask your health care provider or pharmacist if you have questions. COMMON BRAND NAME(S):  toprol, Toprol XL What should I tell my health care provider before I take this medicine? They need to know if you have any of these conditions:  diabetes  heart or vessel disease like slow heart rate, worsening heart failure, heart block, sick sinus syndrome or Raynaud's disease  kidney disease  liver disease  lung or breathing disease, like asthma or emphysema  pheochromocytoma  thyroid disease  an unusual or allergic reaction to metoprolol, other beta-blockers, medicines, foods, dyes, or preservatives  pregnant or trying to get pregnant  breast-feeding How should I use this medicine? Take this medicine by mouth with a glass of water. Follow the directions on the prescription label. Do not crush or chew. Take this medicine with or immediately after meals. Take your doses at regular intervals. Do not take more medicine than directed. Do not stop taking this medicine suddenly. This could lead to serious heart-related effects. Talk to your pediatrician regarding the use of this medicine in children. While this drug may be prescribed for children as young as 6 years for selected conditions, precautions do apply. Overdosage: If you think you have taken too much of this medicine contact a poison control center or emergency room at once. NOTE: This medicine is only for you. Do not share this medicine with others. What if I miss a dose? If you miss a dose, take it as soon as you can. If it is almost time for your next dose, take only that dose. Do not take double or extra doses. What may interact with this  medicine? This medicine may interact with the following medications:  certain medicines for blood pressure, heart disease, irregular heart beat  certain medicines for depression, like monoamine oxidase (MAO) inhibitors, fluoxetine, or paroxetine  clonidine  dobutamine  epinephrine  isoproterenol  reserpine This list may not describe all possible interactions. Give your  health care provider a list of all the medicines, herbs, non-prescription drugs, or dietary supplements you use. Also tell them if you smoke, drink alcohol, or use illegal drugs. Some items may interact with your medicine. What should I watch for while using this medicine? Visit your doctor or health care professional for regular check ups. Contact your doctor right away if your symptoms worsen. Check your blood pressure and pulse rate regularly. Ask your health care professional what your blood pressure and pulse rate should be, and when you should contact them. You may get drowsy or dizzy. Do not drive, use machinery, or do anything that needs mental alertness until you know how this medicine affects you. Do not sit or stand up quickly, especially if you are an older patient. This reduces the risk of dizzy or fainting spells. Contact your doctor if these symptoms continue. Alcohol may interfere with the effect of this medicine. Avoid alcoholic drinks. This medicine may increase blood sugar. Ask your healthcare provider if changes in diet or medicines are needed if you have diabetes. What side effects may I notice from receiving this medicine? Side effects that you should report to your doctor or health care professional as soon as possible:  allergic reactions like skin rash, itching or hives  cold or numb hands or feet  depression  difficulty breathing  faint  fever with sore throat  irregular heartbeat, chest pain  rapid weight gain   signs and symptoms of high blood sugar such as being more thirsty or hungry or having to urinate more than normal. You may also feel very tired or have blurry vision.  swollen legs or ankles Side effects that usually do not require medical attention (report to your doctor or health care professional if they continue or are bothersome):  anxiety or nervousness  change in sex drive or performance  dry skin  headache  nightmares or trouble  sleeping  short term memory loss  stomach upset or diarrhea This list may not describe all possible side effects. Call your doctor for medical advice about side effects. You may report side effects to FDA at 1-800-FDA-1088. Where should I keep my medicine? Keep out of the reach of children. Store at room temperature between 15 and 30 degrees C (59 and 86 degrees F). Throw away any unused medicine after the expiration date. NOTE: This sheet is a summary. It may not cover all possible information. If you have questions about this medicine, talk to your doctor, pharmacist, or health care provider.  2020 Elsevier/Gold Standard (2018-06-06 11:09:41)

## 2019-05-25 DIAGNOSIS — Z23 Encounter for immunization: Secondary | ICD-10-CM | POA: Diagnosis not present

## 2019-06-26 ENCOUNTER — Telehealth: Payer: Self-pay | Admitting: Obstetrics and Gynecology

## 2019-06-26 NOTE — Telephone Encounter (Signed)
Patient cancel/reschedule less than 24 hours due to no childcare. Rescheduled to 07/10/19.

## 2019-06-27 ENCOUNTER — Encounter: Payer: BC Managed Care – PPO | Admitting: Obstetrics and Gynecology

## 2019-06-27 NOTE — Telephone Encounter (Signed)
Thank you for the update!

## 2019-07-06 ENCOUNTER — Other Ambulatory Visit: Payer: Self-pay

## 2019-07-10 ENCOUNTER — Encounter: Payer: Self-pay | Admitting: Obstetrics and Gynecology

## 2019-07-10 ENCOUNTER — Ambulatory Visit: Payer: BC Managed Care – PPO | Admitting: Obstetrics and Gynecology

## 2019-07-10 ENCOUNTER — Other Ambulatory Visit: Payer: Self-pay

## 2019-07-10 ENCOUNTER — Other Ambulatory Visit (HOSPITAL_COMMUNITY)
Admission: RE | Admit: 2019-07-10 | Discharge: 2019-07-10 | Disposition: A | Discharge status: Home / Self Care | Payer: BC Managed Care – PPO | Source: Ambulatory Visit | Attending: Obstetrics and Gynecology | Admitting: Obstetrics and Gynecology

## 2019-07-10 VITALS — BP 118/78 | HR 80 | Temp 97.7°F | Resp 16 | Ht 62.5 in | Wt 150.6 lb

## 2019-07-10 DIAGNOSIS — T8332XA Displacement of intrauterine contraceptive device, initial encounter: Secondary | ICD-10-CM

## 2019-07-10 DIAGNOSIS — Z01419 Encounter for gynecological examination (general) (routine) without abnormal findings: Secondary | ICD-10-CM | POA: Diagnosis not present

## 2019-07-10 NOTE — Patient Instructions (Signed)

## 2019-07-10 NOTE — Progress Notes (Signed)
43 y.o. G1P1 Married [place] female here for annual exam.   Patient states has paragard IUD and was inserted in [place] and was told good for 5 years.  IUD was placed in her uterus at the time of her Cesarean Section.  She declines future childbearing.  Her daughter has autism.   She has a right sided pain on the right, which is light, and only occurs at night when she lays down. Does not occur every night.  Has daily BMs.  She has a history of renal stones.   Dx with tachycardia this year.  She has Toprol XL, which is optional, and she is not taking.   Moved from [place] one year ago. Husband works for [employer].   PCP:  Farris Has, MD   Patient's last menstrual period was 06/23/2019 (exact date).           Sexually active: Yes.    The current method of family planning is IUD--Paragard inserted 2015 in [place].    Exercising: No.  The patient does not participate in regular exercise at present. Smoker:  no  Health Maintenance: Pap:  ~2018 normal.  Uncertain if she had HPV testing.  History of abnormal Pap:  no MMG: 10-10-18 Diag.Bil.w.Us--neg/density C/BiRads1 Colonoscopy:  n/a BMD:   n/a  Result  n/a TDaP: unsure--probably 2015 with pregnancy Gardasil:   no HIV: probably during pregnancy Hep C:unsure Screening Labs:  PCP.  Flu vaccine:  Completed.   reports that she has never smoked. She has never used smokeless tobacco. She reports that she does not drink alcohol or use drugs.  Past Medical History:  Diagnosis Date  . Anxiety   . Depression   . Tachycardia    sees cardiologist    Past Surgical History:  Procedure Laterality Date  . CESAREAN SECTION  2015    Current Outpatient Medications  Medication Sig Dispense Refill  . cholecalciferol (VITAMIN D3) 25 MCG (1000 UT) tablet Take 2,000 Units by mouth daily.    Marland Kitchen escitalopram (LEXAPRO) 5 MG tablet Take 5 mg by mouth daily.    Marland Kitchen LORazepam (ATIVAN) 0.5 MG tablet Take 1 tablet (0.5 mg total) by mouth every 8 (eight)  hours as needed for anxiety. 6 tablet 0  . metoprolol succinate (TOPROL-XL) 25 MG 24 hr tablet Take 1 tablet (25 mg total) by mouth as needed (for palpitations). Take with or immediately following a meal. 90 tablet 0  . vitamin C (ASCORBIC ACID) 500 MG tablet Take 1,000 mg by mouth daily.    . Zinc Sulfate (ZINC 15 PO) Take 1 tablet by mouth daily.     No current facility-administered medications for this visit.     Family History  Problem Relation Age of Onset  . Hypertension Mother   . Hyperlipidemia Mother   . Hyperlipidemia Father   . Hypertension Father   . Thyroid disease Father        hyperthyroid  . Thyroid disease Sister        hyperthyroid  . Stroke Paternal Grandmother   . Heart attack Paternal Grandfather     Review of Systems  Gastrointestinal: Positive for abdominal pain.  All other systems reviewed and are negative.   Exam:   BP 118/78   Pulse 80   Temp 97.7 F (36.5 C) (Temporal)   Resp 16   Ht 5' 2.5" (1.588 m)   Wt 150 lb 9.6 oz (68.3 kg)   LMP 06/23/2019 (Exact Date)   BMI 27.11 kg/m  General appearance: alert, cooperative and appears stated age Head: normocephalic, without obvious abnormality, atraumatic Neck: no adenopathy, supple, symmetrical, trachea midline and thyroid normal to inspection and palpation Lungs: clear to auscultation bilaterally Breasts: normal appearance, no masses or tenderness, No nipple retraction or dimpling, No nipple discharge or bleeding, No axillary adenopathy Heart: regular rate and rhythm Abdomen: soft, non-tender; no masses, no organomegaly Extremities: extremities normal, atraumatic, no cyanosis or edema Skin: skin color, texture, turgor normal. No rashes or lesions Lymph nodes: cervical, supraclavicular, and axillary nodes normal. Neurologic: grossly normal  Pelvic: External genitalia:  no lesions              No abnormal inguinal nodes palpated.              Urethra:  normal appearing urethra with no masses,  tenderness or lesions              Bartholins and Skenes: normal                 Vagina: normal appearing vagina with normal color and discharge, no lesions              Cervix: no lesions              Pap taken: Yes.   Bimanual Exam:  Uterus:  normal size, contour, position, consistency, mobility, non-tender              Adnexa: no mass, fullness, tenderness              Rectal exam: Yes.  .  Confirms.              Anus:  normal sphincter tone, no lesions  Chaperone was present for exam.  Assessment:   Well woman visit with normal exam. Lost IUD threads.  Due for IUD exchange.  Right sided pain.  Hx renal stones.   Plan: Mammogram screening discussed. Self breast awareness reviewed. Pap and HR HPV as above. Guidelines for Calcium, Vitamin D, regular exercise program including cardiovascular and weight bearing exercise. Return for pelvic ultrasound.  Will plan for IUD exchange once this is completed. If right sided pain persists, to PCP.  Follow up annually and prn.   After visit summary provided.

## 2019-07-12 LAB — CYTOLOGY - PAP
Comment: NEGATIVE
Diagnosis: NEGATIVE
High risk HPV: NEGATIVE

## 2019-07-17 ENCOUNTER — Other Ambulatory Visit: Payer: Self-pay

## 2019-07-18 ENCOUNTER — Ambulatory Visit: Payer: BC Managed Care – PPO | Admitting: Cardiology

## 2019-07-19 ENCOUNTER — Ambulatory Visit (INDEPENDENT_AMBULATORY_CARE_PROVIDER_SITE_OTHER): Payer: BC Managed Care – PPO

## 2019-07-19 ENCOUNTER — Other Ambulatory Visit: Payer: Self-pay

## 2019-07-19 ENCOUNTER — Ambulatory Visit: Payer: BC Managed Care – PPO | Admitting: Obstetrics and Gynecology

## 2019-07-19 VITALS — BP 124/68 | HR 78 | Temp 97.4°F | Ht 62.0 in | Wt 151.8 lb

## 2019-07-19 DIAGNOSIS — Z3009 Encounter for other general counseling and advice on contraception: Secondary | ICD-10-CM

## 2019-07-19 DIAGNOSIS — T8332XD Displacement of intrauterine contraceptive device, subsequent encounter: Secondary | ICD-10-CM | POA: Diagnosis not present

## 2019-07-19 DIAGNOSIS — Z30431 Encounter for routine checking of intrauterine contraceptive device: Secondary | ICD-10-CM | POA: Diagnosis not present

## 2019-07-19 DIAGNOSIS — T8332XA Displacement of intrauterine contraceptive device, initial encounter: Secondary | ICD-10-CM

## 2019-07-19 MED ORDER — MISOPROSTOL 200 MCG PO TABS: ORAL_TABLET | ORAL | 0 refills | Status: DC

## 2019-07-19 NOTE — Progress Notes (Signed)
GYNECOLOGY  VISIT   HPI: 43 y.o.   Married  [place]  female   G43P1 with Patient's last menstrual period was 06/23/2019 (exact date).   here for  Pelvic ultrasound to review IUD position.   At her recent exam, her IUD strings were not seen.  She has a 5 year copper IUD placed in [place] at the time of her Cesarean Section in 2015.  She likes her IUD and is due to have it replaced.  GYNECOLOGIC HISTORY: Patient's last menstrual period was 06/23/2019 (exact date). Contraception:  IUD Menopausal hormone therapy:  none Last mammogram:  2/11/20Density C Bi-rads 1 neg  Last pap smear:   07/20/19 Hr Hpv Neg         OB History    Gravida  1   Para  1   Term      Preterm      AB      Living  1     SAB      TAB      Ectopic      Multiple      Live Births                 Patient Active Problem List   Diagnosis Date Noted  . Palpitations 03/06/2019  . Syncope and collapse 03/06/2019  . Shortness of breath 03/06/2019    Past Medical History:  Diagnosis Date  . Anxiety   . Depression   . Tachycardia    sees cardiologist    Past Surgical History:  Procedure Laterality Date  . CESAREAN SECTION  2015    Current Outpatient Medications  Medication Sig Dispense Refill  . cholecalciferol (VITAMIN D3) 25 MCG (1000 UT) tablet Take 2,000 Units by mouth daily.    Marland Kitchen escitalopram (LEXAPRO) 5 MG tablet Take 5 mg by mouth daily.    Marland Kitchen LORazepam (ATIVAN) 0.5 MG tablet Take 1 tablet (0.5 mg total) by mouth every 8 (eight) hours as needed for anxiety. 6 tablet 0  . metoprolol succinate (TOPROL-XL) 25 MG 24 hr tablet Take 1 tablet (25 mg total) by mouth as needed (for palpitations). Take with or immediately following a meal. 90 tablet 0  . misoprostol (CYTOTEC) 200 MCG tablet Place one tablet (200 mcg) per vagina the night before the IUD insertion and then place one tablet in the vagina the am of the IUD insertion. 2 tablet 0  . vitamin C (ASCORBIC ACID) 500 MG tablet Take  1,000 mg by mouth daily.    . Zinc Sulfate (ZINC 15 PO) Take 1 tablet by mouth daily.     No current facility-administered medications for this visit.      ALLERGIES: Patient has no known allergies.  Family History  Problem Relation Age of Onset  . Hypertension Mother   . Hyperlipidemia Mother   . Hyperlipidemia Father   . Hypertension Father   . Thyroid disease Father        hyperthyroid  . Thyroid disease Sister        hyperthyroid  . Stroke Paternal Grandmother   . Heart attack Paternal Grandfather     Social History   Socioeconomic History  . Marital status: Married    Spouse name: Not on file  . Number of children: Not on file  . Years of education: Not on file  . Highest education level: Not on file  Occupational History  . Not on file  Social Needs  . Financial resource strain: Not on file  .  Food insecurity    Worry: Not on file    Inability: Not on file  . Transportation needs    Medical: Not on file    Non-medical: Not on file  Tobacco Use  . Smoking status: Never Smoker  . Smokeless tobacco: Never Used  Substance and Sexual Activity  . Alcohol use: Never    Frequency: Never  . Drug use: Never  . Sexual activity: Yes    Birth control/protection: I.U.D.    Comment: paragard inserted in [place]  Lifestyle  . Physical activity    Days per week: Not on file    Minutes per session: Not on file  . Stress: Not on file  Relationships  . Social Musician on phone: Not on file    Gets together: Not on file    Attends religious service: Not on file    Active member of club or organization: Not on file    Attends meetings of clubs or organizations: Not on file    Relationship status: Not on file  . Intimate partner violence    Fear of current or ex partner: Not on file    Emotionally abused: Not on file    Physically abused: Not on file    Forced sexual activity: Not on file  Other Topics Concern  . Not on file  Social History Narrative   . Not on file    Review of Systems  All other systems reviewed and are negative.   PHYSICAL EXAMINATION:    BP 124/68   Pulse 78   Temp (!) 97.4 F (36.3 C)   Ht 5\' 2"  (1.575 m)   Wt 151 lb 12.8 oz (68.9 kg)   LMP 06/23/2019 (Exact Date)   SpO2 98%   BMI 27.76 kg/m     Gen:  NAD.  Pelvic 06/25/2019 Uterus no masses.  IUD in endometrial canal. String 1.64 cm from ext os.  Ovaries normal with small right CL cyst.  No free fluid.  Chaperone was present for exam.  ASSESSMENT  IUD check up. Expiring copper IUD.  IUD threads lost.  IUD in normal position.   PLAN  Reassurance regarding her IUD location.  Will precert a ParaGard IUD and plan for an exchange. Will plan for Cytotec 200 mcg pv at hs the night before insertion and the am of the insertion.  I have already prescribed this and instructed the patient in use.    An After Visit Summary was printed and given to the patient.  ___15___ minutes face to face time of which over 50% was spent in counseling.

## 2019-07-19 NOTE — Patient Instructions (Signed)
Levonorgestrel intrauterine device (IUD) O que  este medicamento? O LEVONORGESTREL IUD (DIU)  um dispositivo contraceptivo (anticoncepcional). Este dispositivo  colocado dentro do tero por um profissional de sade.  usado para prevenir a Occupational hygienist. Este dispositivo tambm pode ser utilizado para tratar a hemorragia intensa que ocorre durante o seu perodo menstrual. Este medicamento pode ser usado para outros propsitos; em caso de dvidas, pergunte ao seu profissional de sade ou Development worker, international aid. NOMES DE MARCAS COMUNS: Verdia Kuba, LILETTA, Mirena, Skyla O que devo dizer a meu profissional de sade antes de tomar este medicamento? Precisam saber se voc tem algum dos seguintes problemas ou estados de sade:  Papanicolau anormal  cncer de mama, do tero ou do colo do tero  diabetes  endometrite  infeco genital ou plvica atual ou no passado  tem mais de um parceiro sexual ou seu parceiro tem mais de um parceiro sexual  doenas cardacas  histrico de Occupational hygienist ectpica ou tubria  problemas do sistema imunitrio  DIU implantado  doena ou tumor no fgado  problemas com cogulos sanguneos ou tomando medicamentos que afinam o sangue  convulses (crises convulsivas)  [place] drogas injetveis  tero com formato irregular  sangramento vaginal que ainda no foi explicado  reao estranha ou alergia ao levonorgestrel, a outros hormnios, ao silicone ou ao polietileno  reao estranha ou alergia a outros medicamentos, alimentos, corantes ou conservantes  est grvida ou tentando engravidar  est amamentando Como devo usar este medicamento? Este dispositivo  colocado dentro do tero por um profissional de sade. Fale com seu pediatra a respeito do uso deste medicamento em crianas. Pode ser preciso tomar alguns cuidados especiais. Superdosagem: Se achar que tomou uma superdosagem deste medicamento, entre em contato imediatamente com o [facility] ou v  a [employer]. OBSERVAO: Este medicamento  s para voc. No compartilhe este medicamento com outras pessoas. E se eu deixar de tomar uma dose? Isto no se aplica. Se deseja continuar usando este tipo de [employer], ele dever ser substitudo a cada 3 a 6 anos, dependendo da marca do dispositivo inserido. O que pode interagir com este medicamento? No tome este medicamento com nenhum dos seguintes:  amprenavir  bosentana  fosamprenavir Este medicamento tambm pode interagir com os seguintes remdios:  aprepitanto  armodafinila  barbitricos para dormir ou para tratar crises convulsivas  bexaroteno  boceprevir  griseofulvina  alguns medicamentos para crises convulsivas, como carbamazepina, [place], [place], [place], [place], topiramato  modafinila  pioglitazona  rifabutina  rifampicina  rifapentina  alguns medicamentos contra a infeco pelo HIV, como atazanavir, efavirenz, indinavir, lopinavir, nelfinavir, tipranavir e ritonavir  erva-de-so-joo  varfarina Esta lista pode no descrever todas as interaes possveis. D ao seu profissional de sade uma lista de todos os medicamentos, ervas medicinais, remdios de venda livre, ou suplementos alimentares que voc [place]. Diga tambm se voc fuma, bebe, ou [place] drogas ilcitas. Alguns destes podem interagir com o seu medicamento. Ao que devo ficar atento quando estiver [employer] medicamento? Consulte seu mdico ou profissional de sade para acompanhamento regular Museum/gallery curator. Consulte o seu mdico se voc ou seu parceiro tiverem contato sexual com outras pessoas, se tornar-se HIV positiva ou se contrair uma doena transmitida sexualmente. Este medicamento no protege contra uma possvel infeco pelo HIV, AIDS ou outras doenas sexualmente transmissveis. Voc pode verificar a colocao do DIU tocando a parte superior da vagina com os dedos limpos para apalpar os fios. No puxe os fios.  um  bom hbito verificar o posicionamento  do dispositivo aps cada perodo menstrual. Entre imediatamente em contato com o seu mdico ou profissional de sade se seus dedos apalparem mais do corpo do DIU do que apenas os fios ou se no puder apalpar os fios. O DIU pode sair do corpo. Voc pode engravidar se o dispositivo sair. Se voc notar que o DIU est deslocado, use um mtodo anticoncepcional secundrio, como preservativos, e entre em contato com o seu mdico ou profissional de sade. Absorventes internos no interferem com a posio do DIU e podem ser usados durante o seu perodo menstrual. Portadoras deste DIU s podem passar por uma ressonncia magntica (RM) com segurana sob condies especficas. Antes de passar por uma RM, informe o seu mdico ou profissional de sade que tem um DIU implantado e qual  o tipo do dispositivo. Que efeitos colaterais posso sentir aps usar este medicamento? Efeitos colaterais que devem ser informados ao seu mdico ou profissional de sade o mais rpido possvel:  reaes alrgicas, como erupo na pele, coceira, urticria, ou inchao do rosto, dos lbios ou da lngua  febre ou sintomas gripais  lceras genitais  presso alta  ausncia de Amesti por 6 semanas durante o uso  dor, inchao, calor na perna  dor plvica ou sensibilidade ao toque  dor de cabea forte ou sbita  sinais de gravidez  clicas abdominais  falta de ar sbita  dificuldades de equilbrio, para falar, para andar  sangramento ou corrimento vaginal fora do comum  olhos ou pele amarelados Efeitos colaterais que normalmente no precisam de cuidados mdicos (avise ao seu mdico ou profissional de sade se persistirem ou forem incmodos):  acne  dor nos seios  mudana na libido ou no desempenho sexual  ganho ou perda de peso  clicas, tonturas ou desmaio durante a insero do dispositivo  dor de cabea  menstruao irregular durante os primeiros 3 a 6 meses de  uso  enjoo Esta lista pode no descrever todos os efeitos colaterais possveis. Para mais orientaes sobre efeitos colaterais, consulte o seu mdico. Voc pode relatar a ocorrncia de efeitos colaterais  FDA pelo telefone [phone]. Onde devo guardar meu medicamento? Isto no se aplica. OBSERVAO: Este folheto  um resumo. Pode no cobrir todas as informaes possveis. Se tiver dvidas a respeito deste medicamento, fale com seu mdico, farmacutico ou profissional de sade.  2020 Elsevier/Gold Standard (2018-09-07 00:00:00)  Intrauterine Device Insertion An intrauterine device (IUD) is a medical device that gets inserted into the uterus to prevent pregnancy. It is a small, T-shaped device that has one or two nylon strings hanging down from it. The strings hang out of the lower part of the uterus (cervix) to allow for future IUD removal. There are two types of IUDs available:  Copper IUD. This type of IUD has copper wire wrapped around it. Copper makes the uterus and fallopian tubes produce a fluid that kills sperm. A copper IUD may last up to 10 years.  Hormone IUD. This type of IUD is made of plastic and contains the hormone progestin (synthetic progesterone). The hormone thickens mucus in the cervix and prevents sperm from entering the uterus. It also thins the uterine lining to prevent implantation of a fertilized egg. The hormone can weaken or kill the sperm that get into the uterus. A hormone IUD may last 3-5 years. Tell a health care provider about:  Any allergies you have.  All medicines you are taking, including vitamins, herbs, eye drops, creams, and over-the-counter medicines.  Any problems you or family  members have had with anesthetic medicines.  Any blood disorders you have.  Any surgeries you have had.  Any medical conditions you have, including any STIs (sexually transmitted infections) you may have.  Whether you are pregnant or may be pregnant. What are the  risks? Generally, this is a safe procedure. However, problems may occur, including:  Infection.  Bleeding.  Allergic reactions to medicines.  Accidental puncture (perforation) of the uterus, or damage to other structures or organs.  Accidental placement of the IUD either in the muscle layer of the uterus (myometrium) or outside the uterus.  The IUD falling out of the uterus (expulsion). This is more common among women who have recently had a child.  Pregnancy that happens in the fallopian tube (ectopic pregnancy).  Infection of the uterus and fallopian tubes (pelvic inflammatory disease). What happens before the procedure?  Schedule the IUD insertion for when you will have your menstrual period or right after, to make sure you are not pregnant. Placement of the IUD is better tolerated shortly after a menstrual cycle.  Follow instructions from your health care provider about eating or drinking restrictions.  Ask your health care provider about changing or stopping your regular medicines. This is especially important if you are taking diabetes medicines or blood thinners.  You may get a pain reliever to take before the procedure.  You may have tests for: ? Pregnancy. A pregnancy test involves having a urine sample taken. ? STIs. Placing an IUD in someone who has an STI can make the infection worse. ? Cervical cancer. You may have a Pap test to check for this type of cancer. This means collecting cells from your cervix to be examined under a microscope.  You may have a physical exam to determine the size and position of your uterus. The procedure may vary among health care providers and hospitals. What happens during the procedure?  A tool (speculum) will be placed in your vagina and widened so that your health care provider can see your cervix.  Medicine may be applied to your cervix to help lower your risk of infection (antiseptic medicine).  You may be given an anesthetic  medicine to numb each side of your cervix (intracervical block or paracervical block). This medicine is usually given by an injection into the cervix.  A tool (uterine sound) will be inserted into your uterus to determine the length of your uterus and the direction that your uterus may be tilted.  A slim instrument or tube (IUD inserter) that holds the IUD will be inserted into your vagina, through your cervical canal, and into your uterus.  The IUD will be placed in the uterus, and the IUD inserter will be removed.  The strings that are attached to the IUD will be trimmed so that they lie just below the cervix. The procedure may vary among health care providers and hospitals. What happens after the procedure?  You may have bleeding after the procedure. This is normal. It varies from light bleeding (spotting) for a few days to menstrual-like bleeding.  You may have cramping and pain.  You may feel dizzy or light-headed.  You may have lower back pain. Summary  An intrauterine device (IUD) is a small, T-shaped device that has one or two nylon strings hanging down from it.  Two types of IUDs are available. You may have a copper IUD or a hormone IUD.  Schedule the IUD insertion for when you will have your menstrual period or right  after, to make sure you are not pregnant. Placement of the IUD is better tolerated shortly after a menstrual cycle.  You may have bleeding after the procedure. This is normal. It varies from light spotting for a few days to menstrual-like bleeding. This information is not intended to replace advice given to you by your health care provider. Make sure you discuss any questions you have with your health care provider. Document Released: 04/14/2011 Document Revised: 07/29/2017 Document Reviewed: 07/07/2016 Elsevier Patient Education  2020 [employer].

## 2019-07-22 ENCOUNTER — Encounter: Payer: Self-pay | Admitting: Obstetrics and Gynecology

## 2019-07-24 ENCOUNTER — Telehealth: Payer: Self-pay | Admitting: Obstetrics and Gynecology

## 2019-07-24 NOTE — Telephone Encounter (Signed)
Spoke with patient and conveyed benefits. Patient understands/agreeable with the benefits. Patient is aware of the cancellation policy. Appointment scheduled 07/31/19 with Dr. Quincy Simmonds at 11:30am.

## 2019-07-24 NOTE — Telephone Encounter (Signed)
Call placed to convey benefits for iud exchange. UNABLE to leave a message  no voicemail is setup. 

## 2019-07-31 ENCOUNTER — Encounter: Payer: Self-pay | Admitting: Cardiology

## 2019-07-31 ENCOUNTER — Encounter: Payer: Self-pay | Admitting: Obstetrics and Gynecology

## 2019-07-31 ENCOUNTER — Ambulatory Visit (INDEPENDENT_AMBULATORY_CARE_PROVIDER_SITE_OTHER): Payer: BC Managed Care – PPO | Admitting: Cardiology

## 2019-07-31 ENCOUNTER — Other Ambulatory Visit: Payer: Self-pay

## 2019-07-31 ENCOUNTER — Ambulatory Visit (INDEPENDENT_AMBULATORY_CARE_PROVIDER_SITE_OTHER): Payer: BC Managed Care – PPO | Admitting: Obstetrics and Gynecology

## 2019-07-31 VITALS — BP 128/78 | HR 76 | Temp 97.2°F | Ht 62.0 in | Wt 154.0 lb

## 2019-07-31 VITALS — BP 120/74 | HR 89 | Ht 62.0 in | Wt 153.4 lb

## 2019-07-31 DIAGNOSIS — R002 Palpitations: Secondary | ICD-10-CM | POA: Diagnosis not present

## 2019-07-31 DIAGNOSIS — R0602 Shortness of breath: Secondary | ICD-10-CM

## 2019-07-31 DIAGNOSIS — Z3009 Encounter for other general counseling and advice on contraception: Secondary | ICD-10-CM

## 2019-07-31 DIAGNOSIS — Z01812 Encounter for preprocedural laboratory examination: Secondary | ICD-10-CM | POA: Diagnosis not present

## 2019-07-31 DIAGNOSIS — Z30433 Encounter for removal and reinsertion of intrauterine contraceptive device: Secondary | ICD-10-CM

## 2019-07-31 DIAGNOSIS — R55 Syncope and collapse: Secondary | ICD-10-CM | POA: Diagnosis not present

## 2019-07-31 LAB — POCT URINE PREGNANCY: Preg Test, Ur: NEGATIVE

## 2019-07-31 NOTE — Progress Notes (Signed)
GYNECOLOGY  VISIT   HPI: 43 y.o.   Married [place]  female   G1P1 with Patient's last menstrual period was 07/20/2019 (exact date).   here for Paragard IUD exchange.    Used Cytotec last night and this am.   UPT:Neg  GYNECOLOGIC HISTORY: Patient's last menstrual period was 07/20/2019 (exact date). Contraception:  Paragard IUD 2015 Menopausal hormone therapy:  n/a Last mammogram:  10-10-18 Diag.Bil.w.Us--neg/density C/BiRads1 Last pap smear:07-10-19 Neg:Neg HR HPV,  ~2018 normal.  Uncertain if she had HPV testing.          OB History    Gravida  1   Para  1   Term      Preterm      AB      Living  1     SAB      TAB      Ectopic      Multiple      Live Births                 Patient Active Problem List   Diagnosis Date Noted  . Palpitations 03/06/2019  . Syncope and collapse 03/06/2019  . Shortness of breath 03/06/2019    Past Medical History:  Diagnosis Date  . Anxiety   . Depression   . Tachycardia    sees cardiologist    Past Surgical History:  Procedure Laterality Date  . CESAREAN SECTION  2015    Current Outpatient Medications  Medication Sig Dispense Refill  . cholecalciferol (VITAMIN D3) 25 MCG (1000 UT) tablet Take 2,000 Units by mouth daily.    Marland Kitchen. escitalopram (LEXAPRO) 5 MG tablet Take 5 mg by mouth daily.    Marland Kitchen. LORazepam (ATIVAN) 0.5 MG tablet Take 1 tablet (0.5 mg total) by mouth every 8 (eight) hours as needed for anxiety. 6 tablet 0  . misoprostol (CYTOTEC) 200 MCG tablet Place one tablet (200 mcg) per vagina the night before the IUD insertion and then place one tablet in the vagina the am of the IUD insertion. 2 tablet 0  . vitamin C (ASCORBIC ACID) 500 MG tablet Take 1,000 mg by mouth daily.    . Zinc Sulfate (ZINC 15 PO) Take 1 tablet by mouth daily.    . metoprolol succinate (TOPROL-XL) 25 MG 24 hr tablet Take 1 tablet (25 mg total) by mouth as needed (for palpitations). Take with or immediately following a meal. (Patient not  taking: Reported on 07/31/2019) 90 tablet 0   No current facility-administered medications for this visit.      ALLERGIES: Patient has no known allergies.  Family History  Problem Relation Age of Onset  . Hypertension Mother   . Hyperlipidemia Mother   . Hyperlipidemia Father   . Hypertension Father   . Thyroid disease Father        hyperthyroid  . Thyroid disease Sister        hyperthyroid  . Stroke Paternal Grandmother   . Heart attack Paternal Grandfather     Social History   Socioeconomic History  . Marital status: Married    Spouse name: Not on file  . Number of children: Not on file  . Years of education: Not on file  . Highest education level: Not on file  Occupational History  . Not on file  Social Needs  . Financial resource strain: Not on file  . Food insecurity    Worry: Not on file    Inability: Not on file  . Transportation needs  Medical: Not on file    Non-medical: Not on file  Tobacco Use  . Smoking status: Never Smoker  . Smokeless tobacco: Never Used  Substance and Sexual Activity  . Alcohol use: Never    Frequency: Never  . Drug use: Never  . Sexual activity: Yes    Birth control/protection: I.U.D.    Comment: paragard inserted in [place]  Lifestyle  . Physical activity    Days per week: Not on file    Minutes per session: Not on file  . Stress: Not on file  Relationships  . Social Herbalist on phone: Not on file    Gets together: Not on file    Attends religious service: Not on file    Active member of club or organization: Not on file    Attends meetings of clubs or organizations: Not on file    Relationship status: Not on file  . Intimate partner violence    Fear of current or ex partner: Not on file    Emotionally abused: Not on file    Physically abused: Not on file    Forced sexual activity: Not on file  Other Topics Concern  . Not on file  Social History Narrative  . Not on file    Review of Systems  All  other systems reviewed and are negative.   PHYSICAL EXAMINATION:    BP 128/78   Pulse 76   Temp (!) 97.2 F (36.2 C) (Temporal)   Ht 5\' 2"  (1.575 m)   Wt 154 lb (69.9 kg)   LMP 07/20/2019 (Exact Date)   BMI 28.17 kg/m     General appearance: alert, cooperative and appears stated age  Pelvic: External genitalia:  no lesions              Urethra:  normal appearing urethra with no masses, tenderness or lesions              Bartholins and Skenes: normal                 Vagina: normal appearing vagina with normal color and discharge, no lesions              Cervix: no lesions                Bimanual Exam:  Uterus:  normal size, contour, position, consistency, mobility, non-tender              Adnexa: no mass, fullness, tenderness   IUD removal and resinsertion of ParaGard IUD.  Consent for procedures.  Paragard IUD - lot number [id number], expiration 02/2025.  Sterile prep with Hibiclens. Paracervical block with 10 cc of 1% lidocaine - lot number [id number] - expiration April 2022.  Tenaculum to anterior cervical lip.  Os finder used to gently dilate the os. IUD string finder and then dressing forceps used to remove the IUD intact, shown to patient, and discarded.  Uterus sounded to almost 7.5 cm.  Paragard IUD placed without difficulty.  Strings trimmed.  Repeat bimanual exam, no change.  No complications.  Minimal EBL.  Chaperone was present for exam.  ASSESSMENT  Removal of Copper IUD and placement of Paragard IUD.   PLAN  Post procedure instruction and precautions given.  Back up protection for one week, but I did review that her IUD is emergency contraception. IUD card and patient instructions to patient.  Motrin or Aleve prn. FU in 5 weeks.    An After Visit  Summary was printed and given to the patient.

## 2019-07-31 NOTE — Patient Instructions (Signed)
Medication Instructions:  Your physician recommends that you continue on your current medications as directed. Please refer to the Current Medication list given to you today.  *If you need a refill on your cardiac medications before your next appointment, please call your pharmacy*  Lab Work: None.  If you have labs (blood work) drawn today and your tests are completely normal, you will receive your results only by: . MyChart Message (if you have MyChart) OR . A paper copy in the mail If you have any lab test that is abnormal or we need to change your treatment, we will call you to review the results.  Testing/Procedures: None.   Follow-Up: At [facility], you and your health needs are our priority.  As part of our continuing mission to provide you with exceptional heart care, we have created designated Provider Care Teams.  These Care Teams include your primary Cardiologist (physician) and Advanced Practice Providers (APPs -  Physician Assistants and Nurse Practitioners) who all work together to provide you with the care you need, when you need it.  Your next appointment:   6 month(s)  The format for your next appointment:   In Person  Provider:   You may see Dr. Krasowski or the following Advanced Practice Provider on your designated Care Team:    Caitlin Walker, FNP   Other Instructions   

## 2019-07-31 NOTE — Progress Notes (Signed)
Cardiology Office Note:    Date:  07/31/2019   ID:  Jeanne Fitzpatrick, Jeanne Fitzpatrick Jan 12, 1976, MRN [id number]  PCP:  Farris Has, MD  Cardiologist:  Gypsy Balsam, MD    Referring MD: Farris Has, MD   Chief Complaint  Patient presents with  . Follow-up  Doing well  History of Present Illness:    Jeanne Fitzpatrick is a 43 y.o. female with vasovagal syncope, palpitations.  Comes today to my office for follow-up.  Overall doing well.  Denies have any chest pain, tightness, pressure, burning in the chest.  She is trying to exercise on the elliptical and doing well overall she feels fine.  Past Medical History:  Diagnosis Date  . Anxiety   . Depression   . Tachycardia    sees cardiologist    Past Surgical History:  Procedure Laterality Date  . CESAREAN SECTION  2015    Current Medications: Current Meds  Medication Sig  . cholecalciferol (VITAMIN D3) 25 MCG (1000 UT) tablet Take 2,000 Units by mouth daily.  Marland Kitchen escitalopram (LEXAPRO) 5 MG tablet Take 5 mg by mouth daily.  Marland Kitchen LORazepam (ATIVAN) 0.5 MG tablet Take 1 tablet (0.5 mg total) by mouth every 8 (eight) hours as needed for anxiety.  . metoprolol succinate (TOPROL-XL) 25 MG 24 hr tablet Take 1 tablet (25 mg total) by mouth as needed (for palpitations). Take with or immediately following a meal.  . misoprostol (CYTOTEC) 200 MCG tablet Place one tablet (200 mcg) per vagina the night before the IUD insertion and then place one tablet in the vagina the am of the IUD insertion.  . vitamin C (ASCORBIC ACID) 500 MG tablet Take 1,000 mg by mouth daily.  . Zinc Sulfate (ZINC 15 PO) Take 1 tablet by mouth daily.     Allergies:   Patient has no known allergies.   Social History   Socioeconomic History  . Marital status: Married    Spouse name: Not on file  . Number of children: Not on file  . Years of education: Not on file  . Highest education level: Not on file  Occupational History  . Not on file  Social Needs   . Financial resource strain: Not on file  . Food insecurity    Worry: Not on file    Inability: Not on file  . Transportation needs    Medical: Not on file    Non-medical: Not on file  Tobacco Use  . Smoking status: Never Smoker  . Smokeless tobacco: Never Used  Substance and Sexual Activity  . Alcohol use: Never    Frequency: Never  . Drug use: Never  . Sexual activity: Yes    Birth control/protection: I.U.D.    Comment: paragard inserted in [place]  Lifestyle  . Physical activity    Days per week: Not on file    Minutes per session: Not on file  . Stress: Not on file  Relationships  . Social Musician on phone: Not on file    Gets together: Not on file    Attends religious service: Not on file    Active member of club or organization: Not on file    Attends meetings of clubs or organizations: Not on file    Relationship status: Not on file  Other Topics Concern  . Not on file  Social History Narrative  . Not on file     Family History: The patient's family history includes Heart attack in her  paternal grandfather; Hyperlipidemia in her father and mother; Hypertension in her father and mother; Stroke in her paternal grandmother; Thyroid disease in her father and sister. ROS:   Please see the history of present illness.    All 14 point review of systems negative except as described per history of present illness  EKGs/Labs/Other Studies Reviewed:      Recent Labs: 03/01/2019: BUN 12; Creatinine, Ser 0.72; Hemoglobin 13.0; Platelets 261; Potassium 3.5; Sodium 136 03/06/2019: TSH 1.610  Recent Lipid Panel No results found for: CHOL, TRIG, HDL, CHOLHDL, VLDL, LDLCALC, LDLDIRECT  Physical Exam:    VS:  BP 120/74   Pulse 89   Ht 5\' 2"  (1.575 m)   Wt 153 lb 6.4 oz (69.6 kg)   SpO2 98%   BMI 28.06 kg/m     Wt Readings from Last 3 Encounters:  07/31/19 153 lb 6.4 oz (69.6 kg)  07/19/19 151 lb 12.8 oz (68.9 kg)  07/10/19 150 lb 9.6 oz (68.3 kg)      GEN:  Well nourished, well developed in no acute distress HEENT: Normal NECK: No JVD; No carotid bruits LYMPHATICS: No lymphadenopathy CARDIAC: RRR, no murmurs, no rubs, no gallops RESPIRATORY:  Clear to auscultation without rales, wheezing or rhonchi  ABDOMEN: Soft, non-tender, non-distended MUSCULOSKELETAL:  No edema; No deformity  SKIN: Warm and dry LOWER EXTREMITIES: no swelling NEUROLOGIC:  Alert and oriented x 3 PSYCHIATRIC:  Normal affect   ASSESSMENT:    1. Syncope and collapse   2. Shortness of breath   3. Palpitations    PLAN:    In order of problems listed above:  1. Vasovagal syncope.  Denies having any issues right now.  Stay well-hydrated, exercise on a regular basis doing well.  So far all work-up has been negative. 2. Shortness of breath doing better exercise on the regular basis echocardiogram normal ejection fraction. 3. Palpitations denies having any offer her to start taking some metoprolol succinate however she did not have a chance to take it because she is doing well.  See her back in 6 months   Medication Adjustments/Labs and Tests Ordered: Current medicines are reviewed at length with the patient today.  Concerns regarding medicines are outlined above.  No orders of the defined types were placed in this encounter.  Medication changes: No orders of the defined types were placed in this encounter.   Signed, Park Liter, MD, [facility] 07/31/2019 11:05 AM    [place]

## 2019-08-01 DIAGNOSIS — R109 Unspecified abdominal pain: Secondary | ICD-10-CM | POA: Diagnosis not present

## 2019-08-07 ENCOUNTER — Other Ambulatory Visit: Payer: Self-pay | Admitting: Family Medicine

## 2019-08-07 DIAGNOSIS — R109 Unspecified abdominal pain: Secondary | ICD-10-CM

## 2019-08-09 DIAGNOSIS — J029 Acute pharyngitis, unspecified: Secondary | ICD-10-CM | POA: Diagnosis not present

## 2019-08-09 DIAGNOSIS — Z20828 Contact with and (suspected) exposure to other viral communicable diseases: Secondary | ICD-10-CM | POA: Diagnosis not present

## 2019-08-13 DIAGNOSIS — U071 COVID-19: Secondary | ICD-10-CM | POA: Diagnosis not present

## 2019-08-16 ENCOUNTER — Emergency Department (HOSPITAL_BASED_OUTPATIENT_CLINIC_OR_DEPARTMENT_OTHER): Payer: BC Managed Care – PPO

## 2019-08-16 ENCOUNTER — Other Ambulatory Visit: Payer: BC Managed Care – PPO

## 2019-08-16 ENCOUNTER — Other Ambulatory Visit: Payer: Self-pay

## 2019-08-16 ENCOUNTER — Emergency Department (HOSPITAL_BASED_OUTPATIENT_CLINIC_OR_DEPARTMENT_OTHER)
Admission: EM | Admit: 2019-08-16 | Discharge: 2019-08-16 | Disposition: A | Discharge status: Home / Self Care | Payer: BC Managed Care – PPO | Attending: Emergency Medicine | Admitting: Emergency Medicine

## 2019-08-16 ENCOUNTER — Encounter (HOSPITAL_BASED_OUTPATIENT_CLINIC_OR_DEPARTMENT_OTHER): Payer: Self-pay | Admitting: Student

## 2019-08-16 DIAGNOSIS — R42 Dizziness and giddiness: Secondary | ICD-10-CM | POA: Diagnosis not present

## 2019-08-16 DIAGNOSIS — R0602 Shortness of breath: Secondary | ICD-10-CM | POA: Diagnosis not present

## 2019-08-16 DIAGNOSIS — R000 Tachycardia, unspecified: Secondary | ICD-10-CM | POA: Insufficient documentation

## 2019-08-16 DIAGNOSIS — Z79899 Other long term (current) drug therapy: Secondary | ICD-10-CM | POA: Insufficient documentation

## 2019-08-16 DIAGNOSIS — U071 COVID-19: Secondary | ICD-10-CM | POA: Insufficient documentation

## 2019-08-16 DIAGNOSIS — R7989 Other specified abnormal findings of blood chemistry: Secondary | ICD-10-CM | POA: Diagnosis not present

## 2019-08-16 LAB — COMPREHENSIVE METABOLIC PANEL
ALT: 33 U/L (ref 0–44)
AST: 25 U/L (ref 15–41)
Albumin: 4.3 g/dL (ref 3.5–5.0)
Alkaline Phosphatase: 79 U/L (ref 38–126)
Anion gap: 10 (ref 5–15)
BUN: 13 mg/dL (ref 6–20)
CO2: 25 mmol/L (ref 22–32)
Calcium: 9.4 mg/dL (ref 8.9–10.3)
Chloride: 102 mmol/L (ref 98–111)
Creatinine, Ser: 0.67 mg/dL (ref 0.44–1.00)
GFR calc Af Amer: >60 mL/min (ref >60)
GFR calc non Af Amer: >60 mL/min (ref >60)
Glucose, Bld: 108 mg/dL — ABNORMAL HIGH (ref 70–99)
Potassium: 3.6 mmol/L (ref 3.5–5.1)
Sodium: 137 mmol/L (ref 135–145)
Total Bilirubin: 0.7 mg/dL (ref 0.3–1.2)
Total Protein: 7.8 g/dL (ref 6.5–8.1)

## 2019-08-16 LAB — URINALYSIS, ROUTINE W REFLEX MICROSCOPIC
Bilirubin Urine: NEGATIVE
Glucose, UA: NEGATIVE mg/dL
Ketones, ur: NEGATIVE mg/dL
Leukocytes,Ua: NEGATIVE
Nitrite: NEGATIVE
Protein, ur: NEGATIVE mg/dL
Specific Gravity, Urine: 1.01 (ref 1.005–1.030)
pH: 7 (ref 5.0–8.0)

## 2019-08-16 LAB — CBC WITH DIFFERENTIAL/PLATELET
Abs Immature Granulocytes: 0.03 10*3/uL (ref 0.00–0.07)
Basophils Absolute: 0 10*3/uL (ref 0.0–0.1)
Basophils Relative: 0 %
Eosinophils Absolute: 0.1 10*3/uL (ref 0.0–0.5)
Eosinophils Relative: 1 %
HCT: 43.7 % (ref 36.0–46.0)
Hemoglobin: 14.7 g/dL (ref 12.0–15.0)
Immature Granulocytes: 0 %
Lymphocytes Relative: 16 %
Lymphs Abs: 1.6 10*3/uL (ref 0.7–4.0)
MCH: 29.5 pg (ref 26.0–34.0)
MCHC: 33.6 g/dL (ref 30.0–36.0)
MCV: 87.6 fL (ref 80.0–100.0)
Monocytes Absolute: 0.6 10*3/uL (ref 0.1–1.0)
Monocytes Relative: 6 %
Neutro Abs: 7.9 10*3/uL — ABNORMAL HIGH (ref 1.7–7.7)
Neutrophils Relative %: 77 %
Platelets: 265 10*3/uL (ref 150–400)
RBC: 4.99 MIL/uL (ref 3.87–5.11)
RDW: 12.6 % (ref 11.5–15.5)
WBC: 10.2 10*3/uL (ref 4.0–10.5)
nRBC: 0 % (ref 0.0–0.2)

## 2019-08-16 LAB — URINALYSIS, MICROSCOPIC (REFLEX)
Bacteria, UA: NONE SEEN
WBC, UA: NONE SEEN WBC/hpf (ref 0–5)

## 2019-08-16 LAB — MAGNESIUM: Magnesium: 2.1 mg/dL (ref 1.7–2.4)

## 2019-08-16 LAB — PREGNANCY, URINE: Preg Test, Ur: NEGATIVE

## 2019-08-16 LAB — TSH: TSH: 2.074 u[IU]/mL (ref 0.350–4.500)

## 2019-08-16 LAB — D-DIMER, QUANTITATIVE: D-Dimer, Quant: 0.81 ug/mL-FEU — ABNORMAL HIGH (ref 0.00–0.50)

## 2019-08-16 MED ORDER — LORAZEPAM 2 MG/ML IJ SOLN
0.5000 mg | Freq: Once | INTRAMUSCULAR | Status: AC
  Administered 2019-08-16: 0.5 mg via INTRAVENOUS
  Filled 2019-08-16: qty 1

## 2019-08-16 MED ORDER — IOHEXOL 350 MG/ML SOLN
100.0000 mL | Freq: Once | INTRAVENOUS | Status: AC | PRN
  Administered 2019-08-16: 76 mL via INTRAVENOUS

## 2019-08-16 MED ORDER — ACETAMINOPHEN 325 MG PO TABS
650.0000 mg | ORAL_TABLET | Freq: Once | ORAL | Status: AC
  Administered 2019-08-16: 650 mg via ORAL
  Filled 2019-08-16: qty 2

## 2019-08-16 MED ORDER — SODIUM CHLORIDE 0.9 % IV BOLUS
1000.0000 mL | Freq: Once | INTRAVENOUS | Status: AC
  Administered 2019-08-16: 1000 mL via INTRAVENOUS

## 2019-08-16 MED ORDER — METOPROLOL SUCCINATE ER 25 MG PO TB24
25.0000 mg | ORAL_TABLET | Freq: Every day | ORAL | Status: DC
  Administered 2019-08-16: 25 mg via ORAL
  Filled 2019-08-16: qty 1

## 2019-08-16 NOTE — ED Notes (Signed)
PT given gown and asked to change.

## 2019-08-16 NOTE — ED Triage Notes (Signed)
Pt c/o covid positive x 10 days, c/o body tingling, and light headed  today

## 2019-08-16 NOTE — ED Notes (Signed)
Patient transported to CT 

## 2019-08-16 NOTE — ED Provider Notes (Signed)
[facility] EMERGENCY DEPARTMENT Provider Note   CSN: [id number] Arrival date & time: 08/16/19  1359     History Chief Complaint  Patient presents with  . Dizziness    Jeanne Fitzpatrick is a 43 y.o. female with a hx of anxiety, depression, & tachycardia who presents to the ED with complaints of generalized weakness & near syncope which occurred shortly PTA. Patient states she was diagnosed with COVID 19 ten days prior, she has had sxs including fever, chills, body aches, productive cough with phlegm sputum & diarrhea. Her respiratory sxs overall seem to be improving, fevers mostly resolved. Today she was seated on the couch when she started to feel quite hot, generally weak, tingly all over, & lightheaded as if she may pass out. She stood up and felt as if she may have had to have a BM which worsened her sxs. She had a similar episode earlier this year that was thought to have potentially been anxiety related therefore she took a dose of ativan which seemed to improve her sxs some. She still feels a bit generally weak now. Denies syncope, chest pain, increased dyspnea with today's event, nausea, vomiting, visual disturbance, headache, numbness, or focal weakness. Denies leg pain/swelling, hemoptysis, recent surgery/trauma, recent long travel, hormone use, personal hx of cancer, or hx of DVT/PE.      HPI     Past Medical History:  Diagnosis Date  . Anxiety   . Depression   . Tachycardia    sees cardiologist    Patient Active Problem List   Diagnosis Date Noted  . Palpitations 03/06/2019  . Syncope and collapse 03/06/2019  . Shortness of breath 03/06/2019    Past Surgical History:  Procedure Laterality Date  . CESAREAN SECTION  2015     OB History    Gravida  1   Para  1   Term      Preterm      AB      Living  1     SAB      TAB      Ectopic      Multiple      Live Births              Family History  Problem Relation Age of  Onset  . Hypertension Mother   . Hyperlipidemia Mother   . Hyperlipidemia Father   . Hypertension Father   . Thyroid disease Father        hyperthyroid  . Thyroid disease Sister        hyperthyroid  . Stroke Paternal Grandmother   . Heart attack Paternal Grandfather     Social History   Tobacco Use  . Smoking status: Never Smoker  . Smokeless tobacco: Never Used  Substance Use Topics  . Alcohol use: Never  . Drug use: Never    Home Medications Prior to Admission medications   Medication Sig Start Date End Date Taking? Authorizing Provider  cholecalciferol (VITAMIN D3) 25 MCG (1000 UT) tablet Take 2,000 Units by mouth daily.    [provider]  escitalopram (LEXAPRO) 5 MG tablet Take 5 mg by mouth daily. 02/20/19   [provider]  LORazepam (ATIVAN) 0.5 MG tablet Take 1 tablet (0.5 mg total) by mouth every 8 (eight) hours as needed for anxiety. 03/01/19   Elpidio AnisUpstill, Shari, PA-C  metoprolol succinate (TOPROL-XL) 25 MG 24 hr tablet Take 1 tablet (25 mg total) by mouth as needed (for palpitations). Take with  or immediately following a meal. Patient not taking: Reported on 07/31/2019 04/17/19 07/30/20  Park Liter, MD  misoprostol (CYTOTEC) 200 MCG tablet Place one tablet (200 mcg) per vagina the night before the IUD insertion and then place one tablet in the vagina the am of the IUD insertion. 07/19/19   Nunzio Cobbs, MD  vitamin C (ASCORBIC ACID) 500 MG tablet Take 1,000 mg by mouth daily.    [provider]  Zinc Sulfate (ZINC 15 PO) Take 1 tablet by mouth daily.    [provider]    Allergies    Patient has no known allergies.  Review of Systems   Review of Systems  Constitutional: Positive for fever (resolved).  Respiratory: Positive for cough (improving) and shortness of breath (improving).   Cardiovascular: Negative for chest pain and leg swelling.  Gastrointestinal: Positive for diarrhea. Negative for abdominal pain,  nausea and vomiting.  Genitourinary: Negative for dysuria.  Musculoskeletal: Positive for myalgias.  Neurological: Positive for weakness (generalized) and light-headedness. Negative for syncope, numbness and headaches.       Positive for tingling all over.   All other systems reviewed and are negative.   Physical Exam Updated Vital Signs BP (!) 157/97   Pulse (!) 115   Temp 99.9 F (37.7 C)   Resp 18   Ht 5\' 2"  (1.575 m)   Wt 69 kg   LMP 08/15/2019   SpO2 100%   BMI 27.82 kg/m   Physical Exam Vitals and nursing note reviewed.  Constitutional:      General: She is not in acute distress.    Appearance: She is well-developed.  HENT:     Head: Normocephalic and atraumatic.     Right Ear: Ear canal normal. Tympanic membrane is not perforated, erythematous, retracted or bulging.     Left Ear: Ear canal normal. Tympanic membrane is not perforated, erythematous, retracted or bulging.     Ears:     Comments: No mastoid erythema/swelling/tenderness.     Nose:     Right Sinus: No maxillary sinus tenderness or frontal sinus tenderness.     Left Sinus: No maxillary sinus tenderness or frontal sinus tenderness.     Mouth/Throat:     Mouth: Mucous membranes are dry.     Pharynx: Uvula midline. No oropharyngeal exudate or posterior oropharyngeal erythema.     Comments: Posterior oropharynx is symmetric appearing. Patient tolerating own secretions without difficulty. No trismus. No drooling. No hot potato voice. No swelling beneath the tongue, submandibular compartment is soft.  Eyes:     General:        Right eye: No discharge.        Left eye: No discharge.     Extraocular Movements: Extraocular movements intact.     Conjunctiva/sclera: Conjunctivae normal.     Pupils: Pupils are equal, round, and reactive to light.  Cardiovascular:     Rate and Rhythm: Regular rhythm. Tachycardia present.     Heart sounds: No murmur.  Pulmonary:     Effort: Pulmonary effort is normal. No  respiratory distress.     Breath sounds: Normal breath sounds. No wheezing, rhonchi or rales.  Abdominal:     General: There is no distension.     Palpations: Abdomen is soft.     Tenderness: There is no abdominal tenderness. There is no right CVA tenderness, left CVA tenderness, guarding or rebound.  Musculoskeletal:        General: No tenderness.  Cervical back: Normal range of motion and neck supple. No edema or rigidity.     Right lower leg: No edema.     Left lower leg: No edema.  Lymphadenopathy:     Cervical: No cervical adenopathy.  Skin:    General: Skin is warm and dry.     Findings: No rash.  Neurological:     General: No focal deficit present.     Mental Status: She is alert.     Comments: Alert.  Clear speech.  CN III through XII grossly intact.  Sensation grossly intact bilateral upper and lower extremities.  5 out of 5 symmetric grip strength.  5 out of 5 strength with plantar dorsiflexion bilaterally.  Negative pronator drift.  Patient is ambulatory.  Psychiatric:        Behavior: Behavior normal.     ED Results / Procedures / Treatments   Labs (all labs ordered are listed, but only abnormal results are displayed) Labs Reviewed  CBC WITH DIFFERENTIAL/PLATELET - Abnormal; Notable for the following components:      Result Value   Neutro Abs 7.9 (*)    All other components within normal limits  COMPREHENSIVE METABOLIC PANEL - Abnormal; Notable for the following components:   Glucose, Bld 108 (*)    All other components within normal limits  URINALYSIS, ROUTINE W REFLEX MICROSCOPIC - Abnormal; Notable for the following components:   Hgb urine dipstick MODERATE (*)    All other components within normal limits  D-DIMER, QUANTITATIVE (NOT AT [facility]) - Abnormal; Notable for the following components:   D-Dimer, Quant 0.81 (*)    All other components within normal limits  MAGNESIUM  PREGNANCY, URINE  URINALYSIS, MICROSCOPIC (REFLEX)  TSH    EKG EKG  Interpretation  Date/Time:  Thursday August 16 2019 14:38:29 EST Ventricular Rate:  105 PR Interval:    QRS Duration: 74 QT Interval:  332 QTC Calculation: 439 R Axis:   58 Text Interpretation: Sinus tachycardia Probable anteroseptal infarct, old Baseline wander in lead(s) II III aVF No significant change was found Confirmed by Kennis Carina [phone]) on 08/16/2019 3:20:36 PM   Radiology CT Angio Chest PE W/Cm &/Or Wo Cm  Result Date: 08/16/2019 CLINICAL DATA:  COVID-19. Positive D-dimer. EXAM: CT ANGIOGRAPHY CHEST WITH CONTRAST TECHNIQUE: Multidetector CT imaging of the chest was performed using the standard protocol during bolus administration of intravenous contrast. Multiplanar CT image reconstructions and MIPs were obtained to evaluate the vascular anatomy. CONTRAST:  76mL OMNIPAQUE IOHEXOL 350 MG/ML SOLN COMPARISON:  CT scan of the chest dated 03/01/2019 FINDINGS: Cardiovascular: Satisfactory opacification of the pulmonary arteries to the segmental level. No evidence of pulmonary embolism. Normal heart size. No pericardial effusion. Mediastinum/Nodes: No enlarged mediastinal, hilar, or axillary lymph nodes. Thyroid gland, trachea, and esophagus demonstrate no significant findings. Lungs/Pleura: Lungs are clear. No pleural effusion or pneumothorax. Upper Abdomen: Normal. Musculoskeletal: No acute abnormality. Slight pectus excavatum deformity. Review of the MIP images confirms the above findings. IMPRESSION: No pulmonary emboli or other significant abnormalities. Electronically Signed   By: Francene Boyers M.D.   On: 08/16/2019 18:45   DG Chest Port 1 View  Result Date: 08/16/2019 CLINICAL DATA:  Shortness of breath.  COVID positive EXAM: PORTABLE CHEST 1 VIEW COMPARISON:  03/01/2019 FINDINGS: The heart size and mediastinal contours are within normal limits. Both lungs are clear. The visualized skeletal structures are unremarkable. IMPRESSION: No active disease. Electronically Signed   By:  Charlett Nose M.D.   On: 08/16/2019 14:48  Procedures Procedures (including critical care time)  Medications Ordered in ED Medications - No data to display  ED Course  I have reviewed the triage vital signs and the nursing notes.  Pertinent labs & imaging results that were available during my care of the patient were reviewed by me and considered in my medical decision making (see chart for details).    MDM Rules/Calculators/A&P                     Patient presents to the emergency department with complaints of near syncope with generalized weakness and tingly all over sensation.  She was diagnosed with COVID-19 10 days prior, overall having improvement in her respiratory symptoms and fevers.  She is nontoxic-appearing, resting comfortably, oral temp 99.9 with mild tachycardia of 115.  BP elevated, doubt HTN emergency.  Overall has a benign physical exam.  Plan for labs, chest x-ray, EKG, and cardiac monitoring.  Will administer fluids and Tylenol.  CBC: No leukocytosis or anemia. CMP: No electrolyte derangement.  Renal function and LFTs WNL.   Pregnancy test: Negative Urinalysis: Hematuria, PCP recheck.  Patient initially with some improvement in heart rate after 1 L of fluids and Tylenol, however heart rate then returned to the 120 range.  D-dimer was added on as well as a TSH.  D-dimer positive, subsequent CTA negative for PE.  TSH WNL. Patient Later informed me that she was prescribed as needed metoprolol for palpitations which she has not been taking due to having Covid, will give dose in the ED.  Her heart rate is improving, currently 103 bpm which she states is fairly baseline for her.  She is not hypoxic or in respiratory distress.  Appears appropriate for discharge home.  Will discharge home with recommendation for continued oral hydration and utilization of metoprolol as needed. I discussed results, treatment plan, need for follow-up, and return precautions with the patient.  Provided opportunity for questions, patient confirmed understanding and is in agreement with plan.    Final Clinical Impression(s) / ED Diagnoses Final diagnoses:  Lightheadedness  Tachycardia    Rx / DC Orders ED Discharge Orders    None      Analysse Zshoerper Guzzetta was evaluated in Emergency Department on 08/16/2019 for the symptoms described in the history of present illness. He/she was evaluated in the context of the global COVID-19 pandemic, which necessitated consideration that the patient might be at risk for infection with the SARS-CoV-2 virus that causes COVID-19. Institutional protocols and algorithms that pertain to the evaluation of patients at risk for COVID-19 are in a state of rapid change based on information released by regulatory bodies including the CDC and federal and state organizations. These policies and algorithms were followed during the patient's care in the ED.    Desmond Lope 08/16/19 1940    Sabas Sous, MD 08/21/19 [phone]

## 2019-08-16 NOTE — Discharge Instructions (Addendum)
You were seen in the emergency department today for feeling weak and lightheaded.  Your work-up was overall reassuring.  Your labs did not show any substantial abnormalities.  Your CT scan was normal.  There was a little bit of blood in your urine, have this rechecked by primary care provider.  Please continue to stay well-hydrated at home.  Please take your metoprolol as needed for palpitations/tachycardia.   Follow-up with primary care within 3 days.  Return to the emergency department for new or worsening symptoms including but not limited to trouble breathing, chest pain, passing out, or any other concerns.

## 2019-08-28 DIAGNOSIS — U071 COVID-19: Secondary | ICD-10-CM | POA: Diagnosis not present

## 2019-08-28 DIAGNOSIS — R05 Cough: Secondary | ICD-10-CM | POA: Diagnosis not present

## 2019-09-05 DIAGNOSIS — J209 Acute bronchitis, unspecified: Secondary | ICD-10-CM | POA: Diagnosis not present

## 2019-09-05 DIAGNOSIS — J0100 Acute maxillary sinusitis, unspecified: Secondary | ICD-10-CM | POA: Diagnosis not present

## 2019-09-05 DIAGNOSIS — Z8616 Personal history of COVID-19: Secondary | ICD-10-CM | POA: Diagnosis not present

## 2019-09-07 ENCOUNTER — Other Ambulatory Visit: Payer: Self-pay | Admitting: Family Medicine

## 2019-09-07 ENCOUNTER — Ambulatory Visit: Payer: BC Managed Care – PPO | Admitting: Cardiology

## 2019-09-07 ENCOUNTER — Ambulatory Visit
Admission: RE | Admit: 2019-09-07 | Discharge: 2019-09-07 | Disposition: A | Discharge status: Home / Self Care | Payer: BC Managed Care – PPO | Source: Ambulatory Visit | Attending: Family Medicine | Admitting: Family Medicine

## 2019-09-07 DIAGNOSIS — R05 Cough: Secondary | ICD-10-CM

## 2019-09-07 DIAGNOSIS — R059 Cough, unspecified: Secondary | ICD-10-CM

## 2019-09-09 NOTE — Progress Notes (Signed)
Cardiology Office Note:    Date:  09/10/2019   ID:  Jeanne Fitzpatrick, DOB 01/08/1976, MRN [id number]  PCP:  Farris Has, MD  Cardiologist:  Norman Herrlich, MD    Referring MD: Farris Has, MD    ASSESSMENT:    1. Palpitations   2. COVID-19    PLAN:    In order of problems listed above:  1. I have asked her to take a long-acting beta-blocker Toprol-XL every day follow-up in 6 weeks plan as described below 2. She is having persistent symptoms not severe and has had care with her PCP urgent care and ED records reviewed   Next appointment: 6 weeks   Medication Adjustments/Labs and Tests Ordered: Current medicines are reviewed at length with the patient today.  Concerns regarding medicines are outlined above.  Orders Placed This Encounter  Procedures  . EKG 12-Lead   Meds ordered this encounter  Medications  . metoprolol succinate (TOPROL XL) 25 MG 24 hr tablet    Sig: Take 1 tablet (25 mg total) by mouth daily.    Dispense:  90 tablet    Refill:  3    Chief Complaint  Patient presents with  . Palpitations    History of Present Illness:    Jeanne Fitzpatrick is a 44 y.o. female with a hx of palpitation previously seen by my partner Dr. Bing Matter she was last seen 501 2020 at that time was doing well. Compliance with diet, lifestyle and medications: Yes  She is improved but has days where she is aware of her heart beating rapid and has been taking metoprolol as needed she also had one episode of near syncope.  She has had no chest pain or shortness of breath.  She has been taking metoprolol as needed that is helped her MI decided to is placed on a long-acting preparation and see her back in 6 weeks.  She is reassured by this.  She has had a previous evaluation including an echocardiogram I told her at this time I do not think she needs a cardiac MRI with a mild form of COVID-19 without hospitalization.  She is in agreement.  His symptoms did not improve we  will need to repeat an ambulatory heart rhythm monitor at that point I would order a cardiac MRI to exclude myocardial involvement.  He was in the med center [facility] ED 08/16/2019 initial complaints of generalized weakness and near syncope.  There is a notation she had COVID-19 was 10 days prior to that visit..  Evaluation included ED EKG personally reviewed sinus tachycardia 105 bpm otherwise normal.  D-dimer was elevated for age 27.81, urinalysis showed moderate hemoglobin, pregnancy test negative CBC normal except for mild increase in neutrophile count 7900, CMP was normal with a random glucose of 108 normal kidney function potassium and liver function test as well as magnesium.  TSH was also normal.  As part of the visit she underwent a CTA of the chest which showed normal cardiovascular and lung structure no evidence of heart disease pericardial effusion or pulmonary infiltrates.  A subsequent chest x-ray 09/06/2018 normal.   Past Medical History:  Diagnosis Date  . Anxiety   . Depression   . Tachycardia    sees cardiologist    Past Surgical History:  Procedure Laterality Date  . CESAREAN SECTION  2015    Current Medications: Current Meds  Medication Sig  . cholecalciferol (VITAMIN D3) 25 MCG (1000 UT) tablet Take 2,000 Units by mouth daily.  Marland Kitchen  escitalopram (LEXAPRO) 5 MG tablet Take 5 mg by mouth daily.  Marland Kitchen LORazepam (ATIVAN) 0.5 MG tablet Take 1 tablet (0.5 mg total) by mouth every 8 (eight) hours as needed for anxiety.  . vitamin C (ASCORBIC ACID) 500 MG tablet Take 1,000 mg by mouth daily.  . Zinc Sulfate (ZINC 15 PO) Take 1 tablet by mouth daily.  . [DISCONTINUED] metoprolol succinate (TOPROL-XL) 25 MG 24 hr tablet Take 1 tablet (25 mg total) by mouth as needed (for palpitations). Take with or immediately following a meal.     Allergies:   Patient has no known allergies.   Social History   Socioeconomic History  . Marital status: Married    Spouse name: Not on file  .  Number of children: Not on file  . Years of education: Not on file  . Highest education level: Not on file  Occupational History  . Not on file  Tobacco Use  . Smoking status: Never Smoker  . Smokeless tobacco: Never Used  Substance and Sexual Activity  . Alcohol use: Never  . Drug use: Never  . Sexual activity: Yes    Birth control/protection: I.U.D.    Comment: paragard inserted in [place]  Other Topics Concern  . Not on file  Social History Narrative  . Not on file   Social Determinants of Health   Financial Resource Strain:   . Difficulty of Paying Living Expenses: Not on file  Food Insecurity:   . Worried About Charity fundraiser in the Last Year: Not on file  . Ran Out of Food in the Last Year: Not on file  Transportation Needs:   . Lack of Transportation (Medical): Not on file  . Lack of Transportation (Non-Medical): Not on file  Physical Activity:   . Days of Exercise per Week: Not on file  . Minutes of Exercise per Session: Not on file  Stress:   . Feeling of Stress : Not on file  Social Connections:   . Frequency of Communication with Friends and Family: Not on file  . Frequency of Social Gatherings with Friends and Family: Not on file  . Attends Religious Services: Not on file  . Active Member of Clubs or Organizations: Not on file  . Attends Archivist Meetings: Not on file  . Marital Status: Not on file     Family History: The patient's family history includes Heart attack in her paternal grandfather; Hyperlipidemia in her father and mother; Hypertension in her father and mother; Stroke in her paternal grandmother; Thyroid disease in her father and sister. ROS:   Please see the history of present illness.    All other systems reviewed and are negative.  EKGs/Labs/Other Studies Reviewed:    The following studies were reviewed today:  EKG:  EKG ordered today and personally reviewed.  The ekg ordered today demonstrates sinus rhythm normal  EKG  Recent Labs: 08/16/2019: ALT 33; BUN 13; Creatinine, Ser 0.67; Hemoglobin 14.7; Magnesium 2.1; Platelets 265; Potassium 3.6; Sodium 137; TSH 2.074  Recent Lipid Panel No results found for: CHOL, TRIG, HDL, CHOLHDL, VLDL, LDLCALC, LDLDIRECT  Physical Exam:    VS:  BP 132/88 (BP Location: Left Arm, Patient Position: Sitting, Cuff Size: Normal)   Pulse 93   Ht 5\' 2"  (1.575 m)   Wt 155 lb (70.3 kg)   LMP 08/15/2019   SpO2 98%   BMI 28.35 kg/m     Wt Readings from Last 3 Encounters:  09/10/19 155 lb (70.3  kg)  08/16/19 152 lb 1.9 oz (69 kg)  07/31/19 154 lb (69.9 kg)     GEN:  Well nourished, well developed in no acute distress HEENT: Normal NECK: No JVD; No carotid bruits LYMPHATICS: No lymphadenopathy CARDIAC: RRR, no murmurs, rubs, gallops RESPIRATORY:  Clear to auscultation without rales, wheezing or rhonchi  ABDOMEN: Soft, non-tender, non-distended MUSCULOSKELETAL:  No edema; No deformity  SKIN: Warm and dry NEUROLOGIC:  Alert and oriented x 3 PSYCHIATRIC:  Normal affect    Signed, Norman Herrlich, MD  09/10/2019 11:52 AM    [facility]

## 2019-09-10 ENCOUNTER — Other Ambulatory Visit: Payer: Self-pay

## 2019-09-10 ENCOUNTER — Encounter: Payer: Self-pay | Admitting: Cardiology

## 2019-09-10 ENCOUNTER — Ambulatory Visit: Payer: BC Managed Care – PPO | Admitting: Cardiology

## 2019-09-10 VITALS — BP 132/88 | HR 93 | Ht 62.0 in | Wt 155.0 lb

## 2019-09-10 DIAGNOSIS — R002 Palpitations: Secondary | ICD-10-CM | POA: Diagnosis not present

## 2019-09-10 DIAGNOSIS — U071 COVID-19: Secondary | ICD-10-CM

## 2019-09-10 HISTORY — DX: COVID-19: U07.1

## 2019-09-10 MED ORDER — METOPROLOL SUCCINATE ER 25 MG PO TB24: 25.0000 mg | ORAL_TABLET | Freq: Every day | ORAL | 3 refills | Status: DC

## 2019-09-10 NOTE — Patient Instructions (Signed)
Medication Instructions:  STOP taking Metorpolol START Toprol Xl 25mg  daily, this medication has been sent to the pharmacy  *If you need a refill on your cardiac medications before your next appointment, please call your pharmacy*  Lab Work: none If you have labs (blood work) drawn today and your tests are completely normal, you will receive your results only by: MyChart Message (if you have MyChart) OR . A paper copy in the mail If you have any lab test that is abnormal or we need to change your treatment, we will call you to review the results.  Testing/Procedures: none  Follow-Up: At [facility], you and your health needs are our priority.  As part of our continuing mission to provide you with exceptional heart care, we have created designated Provider Care Teams.  These Care Teams include your primary Cardiologist (physician) and Advanced Practice Providers (APPs -  Physician Assistants and Nurse Practitioners) who all work together to provide you with the care you need, when you need it.  Your next appointment:   6 week(s)  The format for your next appointment:   Either In Person or Virtual  Provider:   [facility], MD or Norman Herrlich, DO  Other Instructions Stay Well

## 2019-09-11 ENCOUNTER — Encounter: Payer: Self-pay | Admitting: Obstetrics and Gynecology

## 2019-09-11 ENCOUNTER — Ambulatory Visit: Payer: BC Managed Care – PPO | Admitting: Obstetrics and Gynecology

## 2019-09-11 ENCOUNTER — Ambulatory Visit: Payer: BC Managed Care – PPO | Admitting: Cardiology

## 2019-09-11 VITALS — BP 122/76 | HR 80 | Temp 97.0°F | Ht 62.0 in | Wt 156.0 lb

## 2019-09-11 DIAGNOSIS — Z719 Counseling, unspecified: Secondary | ICD-10-CM

## 2019-09-11 DIAGNOSIS — Z30431 Encounter for routine checking of intrauterine contraceptive device: Secondary | ICD-10-CM | POA: Diagnosis not present

## 2019-09-11 NOTE — Progress Notes (Signed)
GYNECOLOGY  VISIT   HPI: 44 y.o.   Married  [place]  female   G1P1 with Patient's last menstrual period was 08/15/2019.   here for 5 week follow up after Paragard IUD insertion.  Not a lot of bleeding or cramping following insertion.  No painful intercourse.   Patient positive for COVID 2nd week in December. Feeling well now. The week after she was here, she had cough and throat pain.  Her child had fever for one day.  This prompted her to have Covid.  She thinks the contact was from a therapist who comes to her house to see her child. She and her husband tested positive.   GYNECOLOGIC HISTORY: Patient's last menstrual period was 08/15/2019. Contraception:  Paragard IUD 07-31-19 Menopausal hormone therapy:  n/a Last mammogram: 10-10-18 Diag.Bil.w.Us--neg/density C/BiRads1 Last pap smear: 07-10-19 Neg:Neg HR HPV, ~2018 normal. Uncertain if she had HPV testing.         OB History    Gravida  1   Para  1   Term      Preterm      AB      Living  1     SAB      TAB      Ectopic      Multiple      Live Births                 Patient Active Problem List   Diagnosis Date Noted  . COVID-19 09/10/2019  . Palpitations 03/06/2019  . Syncope and collapse 03/06/2019  . Shortness of breath 03/06/2019    Past Medical History:  Diagnosis Date  . Anxiety   . Depression   . Tachycardia    sees cardiologist    Past Surgical History:  Procedure Laterality Date  . CESAREAN SECTION  2015    Current Outpatient Medications  Medication Sig Dispense Refill  . cholecalciferol (VITAMIN D3) 25 MCG (1000 UT) tablet Take 2,000 Units by mouth daily.    Marland Kitchen escitalopram (LEXAPRO) 5 MG tablet Take 5 mg by mouth daily.    Marland Kitchen LORazepam (ATIVAN) 0.5 MG tablet Take 1 tablet (0.5 mg total) by mouth every 8 (eight) hours as needed for anxiety. 6 tablet 0  . metoprolol succinate (TOPROL XL) 25 MG 24 hr tablet Take 1 tablet (25 mg total) by mouth daily. 90 tablet 3  . vitamin C  (ASCORBIC ACID) 500 MG tablet Take 1,000 mg by mouth daily.    . Zinc Sulfate (ZINC 15 PO) Take 1 tablet by mouth daily.     No current facility-administered medications for this visit.     ALLERGIES: Patient has no known allergies.  Family History  Problem Relation Age of Onset  . Hypertension Mother   . Hyperlipidemia Mother   . Hyperlipidemia Father   . Hypertension Father   . Thyroid disease Father        hyperthyroid  . Thyroid disease Sister        hyperthyroid  . Stroke Paternal Grandmother   . Heart attack Paternal Grandfather     Social History   Socioeconomic History  . Marital status: Married    Spouse name: Not on file  . Number of children: Not on file  . Years of education: Not on file  . Highest education level: Not on file  Occupational History  . Not on file  Tobacco Use  . Smoking status: Never Smoker  . Smokeless tobacco: Never Used  Substance and  Sexual Activity  . Alcohol use: Never  . Drug use: Never  . Sexual activity: Yes    Birth control/protection: I.U.D.    Comment: paragard inserted in [place]  Other Topics Concern  . Not on file  Social History Narrative  . Not on file   Social Determinants of Health   Financial Resource Strain:   . Difficulty of Paying Living Expenses: Not on file  Food Insecurity:   . Worried About Charity fundraiser in the Last Year: Not on file  . Ran Out of Food in the Last Year: Not on file  Transportation Needs:   . Lack of Transportation (Medical): Not on file  . Lack of Transportation (Non-Medical): Not on file  Physical Activity:   . Days of Exercise per Week: Not on file  . Minutes of Exercise per Session: Not on file  Stress:   . Feeling of Stress : Not on file  Social Connections:   . Frequency of Communication with Friends and Family: Not on file  . Frequency of Social Gatherings with Friends and Family: Not on file  . Attends Religious Services: Not on file  . Active Member of Clubs or  Organizations: Not on file  . Attends Archivist Meetings: Not on file  . Marital Status: Not on file  Intimate Partner Violence:   . Fear of Current or Ex-Partner: Not on file  . Emotionally Abused: Not on file  . Physically Abused: Not on file  . Sexually Abused: Not on file    Review of Systems  All other systems reviewed and are negative.   PHYSICAL EXAMINATION:    BP 122/76   Pulse 80   Temp (!) 97 F (36.1 C) (Temporal)   Ht 5\' 2"  (1.575 m)   Wt 156 lb (70.8 kg)   LMP 08/15/2019   BMI 28.53 kg/m     General appearance: alert, cooperative and appears stated age  Pelvic: External genitalia:  no lesions              Urethra:  normal appearing urethra with no masses, tenderness or lesions              Bartholins and Skenes: normal                 Vagina: normal appearing vagina with normal color and discharge, no lesions              Cervix: no lesions.  IUD string noted.  Small amount of blood.                Bimanual Exam:  Uterus:  normal size, contour, position, consistency, mobility, non-tender              Adnexa: no mass, fullness, tenderness       Chaperone was present for exam.  ASSESSMENT  IUD check up. Status post ParaGard IUD insertion.  Doing well.  Recent Covid infection. Health education regarding cancer screening appropriate for her age.  PLAN  Reassurance regarding IUD position.  Menstrual profiles discussed.  She will schedule a screening mammogram.  We discussed the new cervical cancer screening guidelines. FU for annual exam and prn.   An After Visit Summary was printed and given to the patient.  ____15__ minutes face to face time of which over 50% was spent in counseling.

## 2019-09-19 ENCOUNTER — Other Ambulatory Visit: Payer: Self-pay | Admitting: Obstetrics and Gynecology

## 2019-09-19 DIAGNOSIS — Z1231 Encounter for screening mammogram for malignant neoplasm of breast: Secondary | ICD-10-CM

## 2019-09-20 DIAGNOSIS — R109 Unspecified abdominal pain: Secondary | ICD-10-CM | POA: Diagnosis not present

## 2019-09-26 ENCOUNTER — Ambulatory Visit
Admission: RE | Admit: 2019-09-26 | Discharge: 2019-09-26 | Disposition: A | Discharge status: Home / Self Care | Payer: BC Managed Care – PPO | Source: Ambulatory Visit | Attending: Family Medicine | Admitting: Family Medicine

## 2019-09-26 DIAGNOSIS — R109 Unspecified abdominal pain: Secondary | ICD-10-CM

## 2019-09-26 DIAGNOSIS — N281 Cyst of kidney, acquired: Secondary | ICD-10-CM | POA: Diagnosis not present

## 2019-10-20 NOTE — Progress Notes (Signed)
Virtual Visit via Video Note   This visit type was conducted due to national recommendations for restrictions regarding the COVID-19 Pandemic (e.g. social distancing) in an effort to limit this patient's exposure and mitigate transmission in our community.  Due to her co-morbid illnesses, this patient is at least at moderate risk for complications without adequate follow up.  This format is felt to be most appropriate for this patient at this time.  All issues noted in this document were discussed and addressed.  A limited physical exam was performed with this format.  Please refer to the patient's chart for her consent to telehealth for [facility].  Use the [facility] heartbeat app for the visit She has no device at home to check heart rate and blood pressure 15 minutes total spent Date:  10/22/2019   ID:  Jeanne Fitzpatrick, DOB 04-15-1976, MRN [id number]  PCP:  Farris Has, MD  Cardiologist:  Norman Herrlich, MD    Referring MD: Farris Has, MD    ASSESSMENT:    1. Palpitations   2. COVID-19   3. Shortness of breath    PLAN:    In order of problems listed above:  1. Resolved with recovery from COVID-19 she stopped her beta-blocker at this time I do no further evaluation or treatment I asked her to see me in the office as needed in the future.  Labs including thyroid and recent hemoglobin were normal. 2. She has made a good recovery from COVID-19 and has no further shortness of breath or exercise intolerance   Next appointment: As needed   Medication Adjustments/Labs and Tests Ordered: Current medicines are reviewed at length with the patient today.  Concerns regarding medicines are outlined above.  No orders of the defined types were placed in this encounter.  No orders of the defined types were placed in this encounter.   No chief complaint on file.   History of Present Illness:    Jeanne Fitzpatrick is a 44 y.o. female with a hx of palpitation and  COVID 19 infection in December 2020 last seen 09/10/2019.  She was evaluated in the med center [facility] ED 08/16/2019 with initial complaints of generalized weakness and near syncope.  There is a notation she had COVID-19  10 days prior to that visit.Marland Kitchen Her Evaluation included ED EKG personally reviewed sinus tachycardia 105 bpm otherwise normal.  D-dimer was elevated for age 10.81, urinalysis showed moderate hemoglobin, pregnancy test negative CBC normal except for mild increase in neutrophile count 7900, CMP was normal with a random glucose of 108 normal kidney function potassium and liver function test as well as magnesium.  TSH was also normal.  As part of the visit she underwent a CTA of the chest which showed normal cardiovascular and lung structure no evidence of heart disease pericardial effusion or pulmonary infiltrates.  A subsequent chest x-ray 09/06/2018 normal.   Compliance with diet, lifestyle and medications: Yes  She is slowly and steadily improved she stopped taking metoprolol her heart rate was 90-100 bpm without palpitation.  She is exercising 30 minutes 3 days a week trying to get to a goal of 150 minutes/week of exercise tolerance fatigue and shortness of breath have resolved and she feels back to normal.  Over-the-counter proarrhythmic drugs Past Medical History:  Diagnosis Date  . Anxiety   . Depression   . Tachycardia    sees cardiologist    Past Surgical History:  Procedure Laterality Date  .  CESAREAN SECTION  2015    Current Medications: Current Meds  Medication Sig  . cholecalciferol (VITAMIN D3) 25 MCG (1000 UT) tablet Take 2,000 Units by mouth daily.  Marland Kitchen escitalopram (LEXAPRO) 5 MG tablet Take 5 mg by mouth daily.  Marland Kitchen LORazepam (ATIVAN) 0.5 MG tablet Take 1 tablet (0.5 mg total) by mouth every 8 (eight) hours as needed for anxiety.  . vitamin C (ASCORBIC ACID) 500 MG tablet Take 1,000 mg by mouth daily.  . Zinc Sulfate (ZINC 15 PO) Take 1 tablet by mouth daily.       Allergies:   Patient has no known allergies.   Social History   Socioeconomic History  . Marital status: Married    Spouse name: Not on file  . Number of children: Not on file  . Years of education: Not on file  . Highest education level: Not on file  Occupational History  . Not on file  Tobacco Use  . Smoking status: Never Smoker  . Smokeless tobacco: Never Used  Substance and Sexual Activity  . Alcohol use: Never  . Drug use: Never  . Sexual activity: Yes    Birth control/protection: I.U.D.    Comment: paragard inserted in [place]  Other Topics Concern  . Not on file  Social History Narrative  . Not on file   Social Determinants of Health   Financial Resource Strain:   . Difficulty of Paying Living Expenses: Not on file  Food Insecurity:   . Worried About Charity fundraiser in the Last Year: Not on file  . Ran Out of Food in the Last Year: Not on file  Transportation Needs:   . Lack of Transportation (Medical): Not on file  . Lack of Transportation (Non-Medical): Not on file  Physical Activity:   . Days of Exercise per Week: Not on file  . Minutes of Exercise per Session: Not on file  Stress:   . Feeling of Stress : Not on file  Social Connections:   . Frequency of Communication with Friends and Family: Not on file  . Frequency of Social Gatherings with Friends and Family: Not on file  . Attends Religious Services: Not on file  . Active Member of Clubs or Organizations: Not on file  . Attends Archivist Meetings: Not on file  . Marital Status: Not on file     Family History: The patient's family history includes Heart attack in her paternal grandfather; Hyperlipidemia in her father and mother; Hypertension in her father and mother; Stroke in her paternal grandmother; Thyroid disease in her father and sister. ROS:   Please see the history of present illness.    All other systems reviewed and are negative.  EKGs/Labs/Other Studies Reviewed:     The following studies were reviewed today:   Recent Labs: 08/16/2019: ALT 33; BUN 13; Creatinine, Ser 0.67; Hemoglobin 14.7; Magnesium 2.1; Platelets 265; Potassium 3.6; Sodium 137; TSH 2.074  Recent Lipid Panel No results found for: CHOL, TRIG, HDL, CHOLHDL, VLDL, LDLCALC, LDLDIRECT  Physical Exam:    VS:  Ht 5\' 2"  (1.575 m)   Wt 150 lb (68 kg)   BMI 27.44 kg/m     Wt Readings from Last 3 Encounters:  10/22/19 150 lb (68 kg)  09/11/19 156 lb (70.8 kg)  09/10/19 155 lb (70.3 kg)    Constitutional, well-nourished well-developed in no acute distress Vital signs reviewed Eyes, conjunctiva and sclera are normal without pallor or icterus extraocular motions intact and normal  there is no lid lag Respiratory, normal effort and excursion no audible wheezing without a stethoscope Cardiovascular, no neck vein distention or peripheral edema Skin, no rash skin lesion or ulceration of the extremities Neurologic, cranial nerves II to XII are grossly intact and the patient moves all 4 extremities Neuro/Psychiatric, judgment and thought processes are intact and coherent, alert and oriented x3, mood and affect appear normal.   Signed, Norman Herrlich, MD  10/22/2019 10:32 AM    [facility]

## 2019-10-22 ENCOUNTER — Telehealth (INDEPENDENT_AMBULATORY_CARE_PROVIDER_SITE_OTHER): Payer: BC Managed Care – PPO | Admitting: Cardiology

## 2019-10-22 ENCOUNTER — Other Ambulatory Visit: Payer: Self-pay

## 2019-10-22 ENCOUNTER — Encounter: Payer: Self-pay | Admitting: Cardiology

## 2019-10-22 VITALS — Ht 62.0 in | Wt 150.0 lb

## 2019-10-22 DIAGNOSIS — U071 COVID-19: Secondary | ICD-10-CM

## 2019-10-22 DIAGNOSIS — R0602 Shortness of breath: Secondary | ICD-10-CM

## 2019-10-22 DIAGNOSIS — R002 Palpitations: Secondary | ICD-10-CM | POA: Diagnosis not present

## 2019-10-22 NOTE — Patient Instructions (Signed)
Medication Instructions:  Your physician recommends that you continue on your current medications as directed. Please refer to the Current Medication list given to you today.  *If you need a refill on your cardiac medications before your next appointment, please call your pharmacy*  Lab Work: None ordered  If you have labs (blood work) drawn today and your tests are completely normal, you will receive your results only by: . MyChart Message (if you have MyChart) OR . A paper copy in the mail If you have any lab test that is abnormal or we need to change your treatment, we will call you to review the results.  Testing/Procedures: None ordered  Follow-Up: At [facility], you and your health needs are our priority.  As part of our continuing mission to provide you with exceptional heart care, we have created designated Provider Care Teams.  These Care Teams include your primary Cardiologist (physician) and Advanced Practice Providers (APPs -  Physician Assistants and Nurse Practitioners) who all work together to provide you with the care you need, when you need it.  Your next appointment:   AS NEEDED 

## 2019-10-29 ENCOUNTER — Ambulatory Visit
Admission: RE | Admit: 2019-10-29 | Discharge: 2019-10-29 | Disposition: A | Discharge status: Home / Self Care | Payer: BC Managed Care – PPO | Source: Ambulatory Visit | Attending: Obstetrics and Gynecology | Admitting: Obstetrics and Gynecology

## 2019-10-29 ENCOUNTER — Other Ambulatory Visit: Payer: Self-pay

## 2019-10-29 DIAGNOSIS — Z1231 Encounter for screening mammogram for malignant neoplasm of breast: Secondary | ICD-10-CM

## 2019-10-30 ENCOUNTER — Ambulatory Visit: Payer: BC Managed Care – PPO

## 2019-11-08 ENCOUNTER — Ambulatory Visit: Payer: BC Managed Care – PPO

## 2020-01-14 ENCOUNTER — Telehealth: Payer: Self-pay

## 2020-01-14 NOTE — Telephone Encounter (Signed)
Spoke with patient. OV scheduled for 5/18 at 2:45pm with Dr. Edward Jolly. Patient is agreeable to date and time.   Routing to provider for final review. Patient is agreeable to disposition. Will close encounter.

## 2020-01-14 NOTE — Telephone Encounter (Signed)
Patient is calling in regards to blood in urine and lower abdominal pain. Patient states she has an IUD and would like to be seen today. Need triage to assist.

## 2020-01-14 NOTE — Telephone Encounter (Signed)
Please add her on to the schedule for tomorrow with me.

## 2020-01-14 NOTE — Telephone Encounter (Signed)
Spoke with patient. Patient reports lower abdominal discomfort 4/10, urinary frequency, and intermittent spotting. Symptoms started on 5/16. Patient has a paragard IUD placed 07/2019. Cycles have been regular, LMP 01/01/20.   Voiding normal amounts. Denies frequency, fever/chills, N/V, odor, dysuria, flank pain or abnormal vaginal d/c. No partner changes or STD concerns.  OV scheduled for 5/19 at 8:30am. Precautions provided for new or worsening symptoms. Advised patient I will review with Dr. Edward Jolly and return call if any additional recommendations, patient agreeable.   Routing to Dr. Edward Jolly for final review.

## 2020-01-15 ENCOUNTER — Encounter: Payer: Self-pay | Admitting: Obstetrics and Gynecology

## 2020-01-15 ENCOUNTER — Other Ambulatory Visit: Payer: Self-pay

## 2020-01-15 ENCOUNTER — Ambulatory Visit: Payer: BC Managed Care – PPO | Admitting: Obstetrics and Gynecology

## 2020-01-15 VITALS — BP 110/80 | Temp 97.5°F | Ht 62.0 in | Wt 151.0 lb

## 2020-01-15 DIAGNOSIS — R8290 Unspecified abnormal findings in urine: Secondary | ICD-10-CM | POA: Diagnosis not present

## 2020-01-15 DIAGNOSIS — R350 Frequency of micturition: Secondary | ICD-10-CM

## 2020-01-15 DIAGNOSIS — R102 Pelvic and perineal pain unspecified side: Secondary | ICD-10-CM

## 2020-01-15 DIAGNOSIS — N939 Abnormal uterine and vaginal bleeding, unspecified: Secondary | ICD-10-CM

## 2020-01-15 DIAGNOSIS — Z975 Presence of (intrauterine) contraceptive device: Secondary | ICD-10-CM | POA: Diagnosis not present

## 2020-01-15 LAB — POCT URINALYSIS DIPSTICK
Bilirubin, UA: NEGATIVE
Glucose, UA: NEGATIVE
Ketones, UA: NEGATIVE
Leukocytes, UA: NEGATIVE
Nitrite, UA: NEGATIVE
Protein, UA: NEGATIVE
Urobilinogen, UA: 0.2 E.U./dL
pH, UA: 5 (ref 5.0–8.0)

## 2020-01-15 NOTE — Progress Notes (Signed)
GYNECOLOGY  VISIT   HPI: 44 y.o.   Married  [place]  female   G34P1 with Patient's last menstrual period was 01/01/2020 (exact date).   here for urinary frequency and vaginal spotting.  Spotting started 2 days ago.    She feels a swelling feeling in the middle of her lower abdomen.  She feels a pointed pain.  She took an Stage manager medication from [place], and this helped.   She has not had intercourse for 2 weeks.  No concern for sexually transmitted disease.  No partner change.   She did a abdominal US 09/26/19 and she was noted to have a renal cyst.   She has a ParaGard IUD.     Denies dysuria.   Normal bowel function.   Her uncle died of Covid in [place] 2 weeks ago.   Urine dip - moderate RBCs.   GYNECOLOGIC HISTORY: Patient's last menstrual period was 01/01/2020 (exact date). Contraception:  Paragard IUD 07-31-19 Menopausal hormone therapy:  n/a Last mammogram: 10-29-19 3D/Neg/density C/BiRads1 Last pap smear: 07-10-19 Neg:Neg HR ONG,~2952 normal. Uncertain if she had HPV testing.        OB History    Gravida  1   Para  1   Term      Preterm      AB      Living  1     SAB      TAB      Ectopic      Multiple      Live Births                 Patient Active Problem List   Diagnosis Date Noted  . COVID-19 09/10/2019  . Palpitations 03/06/2019  . Syncope and collapse 03/06/2019  . Shortness of breath 03/06/2019    Past Medical History:  Diagnosis Date  . Anxiety   . Depression   . Tachycardia    sees cardiologist    Past Surgical History:  Procedure Laterality Date  . CESAREAN SECTION  2015    Current Outpatient Medications  Medication Sig Dispense Refill  . cholecalciferol (VITAMIN D3) 25 MCG (1000 UT) tablet Take 2,000 Units by mouth daily.    Marland Kitchen escitalopram (LEXAPRO) 5 MG tablet Take 5 mg by mouth daily.    Marland Kitchen LORazepam (ATIVAN) 0.5 MG tablet Take 1 tablet (0.5 mg total) by mouth every 8 (eight) hours as needed for  anxiety. 6 tablet 0  . vitamin C (ASCORBIC ACID) 500 MG tablet Take 1,000 mg by mouth daily.    . Zinc Sulfate (ZINC 15 PO) Take 1 tablet by mouth daily.     No current facility-administered medications for this visit.     ALLERGIES: Patient has no known allergies.  Family History  Problem Relation Age of Onset  . Hypertension Mother   . Hyperlipidemia Mother   . Hyperlipidemia Father   . Hypertension Father   . Thyroid disease Father        hyperthyroid  . Thyroid disease Sister        hyperthyroid  . Stroke Paternal Grandmother   . Heart attack Paternal Grandfather     Social History   Socioeconomic History  . Marital status: Married    Spouse name: Not on file  . Number of children: Not on file  . Years of education: Not on file  . Highest education level: Not on file  Occupational History  . Not on file  Tobacco Use  . Smoking status:  Never Smoker  . Smokeless tobacco: Never Used  Substance and Sexual Activity  . Alcohol use: Never  . Drug use: Never  . Sexual activity: Yes    Birth control/protection: I.U.D.    Comment: paragard inserted in [place]  Other Topics Concern  . Not on file  Social History Narrative  . Not on file   Social Determinants of Health   Financial Resource Strain:   . Difficulty of Paying Living Expenses:   Food Insecurity:   . Worried About Programme researcher, broadcasting/film/video in the Last Year:   . Barista in the Last Year:   Transportation Needs:   . Freight forwarder (Medical):   Marland Kitchen Lack of Transportation (Non-Medical):   Physical Activity:   . Days of Exercise per Week:   . Minutes of Exercise per Session:   Stress:   . Feeling of Stress :   Social Connections:   . Frequency of Communication with Friends and Family:   . Frequency of Social Gatherings with Friends and Family:   . Attends Religious Services:   . Active Member of Clubs or Organizations:   . Attends Banker Meetings:   Marland Kitchen Marital Status:   Intimate  Partner Violence:   . Fear of Current or Ex-Partner:   . Emotionally Abused:   Marland Kitchen Physically Abused:   . Sexually Abused:     Review of Systems  Genitourinary: Positive for frequency and pelvic pain (lower mid pelvic pain).  All other systems reviewed and are negative.  PHYSICAL EXAMINATION:    BP 110/80   Temp (!) 97.5 F (36.4 C) (Temporal)   Ht 5\' 2"  (1.575 m)   Wt 151 lb (68.5 kg)   LMP 01/01/2020 (Exact Date)   BMI 27.62 kg/m     General appearance: alert, cooperative and appears stated age   Abdomen: soft, non-tender, no masses,  no organomegaly   Pelvic: External genitalia:  no lesions              Urethra:  normal appearing urethra with no masses, tenderness or lesions              Bartholins and Skenes: normal                 Vagina: normal appearing vagina with normal color and discharge, no lesions              Cervix: no lesions.  Blood in the vagina coming from the os.                 Bimanual Exam:  Uterus:  normal size, contour, position, consistency, mobility, non-tender              Adnexa: no mass, fullness, tenderness           Chaperone was present for exam.  ASSESSMENT  Abnormal uterine bleeding.  Pelvic pain.  IUD in place.  PLAN  We discussed infection, IUD position, and ovarian cysts as possible explanation for her symptoms.  Return for pelvic 03/02/2020.  Urine micro and culture.    An After Visit Summary was printed and given to the patient.  ___20___ minutes face to face time of which over 50% was spent in counseling.

## 2020-01-16 ENCOUNTER — Ambulatory Visit: Payer: Self-pay | Admitting: Obstetrics and Gynecology

## 2020-01-16 LAB — URINALYSIS, MICROSCOPIC ONLY
Bacteria, UA: NONE SEEN
Casts: NONE SEEN /lpf
WBC, UA: NONE SEEN /hpf (ref 0–5)

## 2020-01-17 ENCOUNTER — Telehealth: Payer: Self-pay | Admitting: Obstetrics and Gynecology

## 2020-01-17 LAB — URINE CULTURE: Organism ID, Bacteria: NO GROWTH

## 2020-01-17 NOTE — Telephone Encounter (Signed)
Spoke with patient regarding benefits for recommended ultrasound. Patient is aware that ultrasound is transvaginal. Patient acknowledges understanding of information presented. Patient is aware of cancellation policy.  

## 2020-01-17 NOTE — Telephone Encounter (Signed)
Call to patient to review benefits and schedule recommended Pelvic ultrasound with Dr. Edward Jolly. Unable to leave voicemail due to mailbox not being set up.

## 2020-01-24 ENCOUNTER — Encounter: Payer: Self-pay | Admitting: Obstetrics and Gynecology

## 2020-01-24 ENCOUNTER — Other Ambulatory Visit: Payer: Self-pay

## 2020-01-24 ENCOUNTER — Ambulatory Visit (INDEPENDENT_AMBULATORY_CARE_PROVIDER_SITE_OTHER): Payer: BC Managed Care – PPO | Admitting: Obstetrics and Gynecology

## 2020-01-24 ENCOUNTER — Ambulatory Visit (INDEPENDENT_AMBULATORY_CARE_PROVIDER_SITE_OTHER): Payer: BC Managed Care – PPO

## 2020-01-24 VITALS — BP 110/76 | HR 80 | Temp 97.6°F | Ht 62.0 in | Wt 151.0 lb

## 2020-01-24 DIAGNOSIS — N939 Abnormal uterine and vaginal bleeding, unspecified: Secondary | ICD-10-CM | POA: Diagnosis not present

## 2020-01-24 DIAGNOSIS — R102 Pelvic and perineal pain: Secondary | ICD-10-CM | POA: Diagnosis not present

## 2020-01-24 DIAGNOSIS — Z975 Presence of (intrauterine) contraceptive device: Secondary | ICD-10-CM | POA: Diagnosis not present

## 2020-01-24 DIAGNOSIS — Z30431 Encounter for routine checking of intrauterine contraceptive device: Secondary | ICD-10-CM

## 2020-01-24 NOTE — Progress Notes (Signed)
GYNECOLOGY  VISIT   HPI: 44 y.o.   Married  [place]  female   G1P1 with Patient's last menstrual period was 01/01/2020 (approximate).   here for pelvic ultrasound.    Has vaginal spotting and swelling feeling in the mid abdomen.  Her bleeding has stopped.   Has Paragard IUD.   Her urine micro showed RBCs, which may have come from vaginal bleeding.  Her UC was negative for infection.   No partner change.   GYNECOLOGIC HISTORY: Patient's last menstrual period was 01/01/2020 (approximate). Contraception: Paragard IUD 07-31-19 Menopausal hormone therapy:  n/a Last mammogram: 10-29-19 3D/Neg/density C/BiRads1 Last pap smear: 07-10-19 Neg:Neg HR NTI,~1443 normal. Uncertain if she had HPV testing.        OB History    Gravida  1   Para  1   Term      Preterm      AB      Living  1     SAB      TAB      Ectopic      Multiple      Live Births                 Patient Active Problem List   Diagnosis Date Noted  . COVID-19 09/10/2019  . Palpitations 03/06/2019  . Syncope and collapse 03/06/2019  . Shortness of breath 03/06/2019    Past Medical History:  Diagnosis Date  . Anxiety   . Depression   . Tachycardia    sees cardiologist    Past Surgical History:  Procedure Laterality Date  . CESAREAN SECTION  2015    Current Outpatient Medications  Medication Sig Dispense Refill  . cholecalciferol (VITAMIN D3) 25 MCG (1000 UT) tablet Take 2,000 Units by mouth daily.    Marland Kitchen escitalopram (LEXAPRO) 5 MG tablet Take 5 mg by mouth daily.    Marland Kitchen LORazepam (ATIVAN) 0.5 MG tablet Take 1 tablet (0.5 mg total) by mouth every 8 (eight) hours as needed for anxiety. 6 tablet 0  . vitamin C (ASCORBIC ACID) 500 MG tablet Take 1,000 mg by mouth daily.    . Zinc Sulfate (ZINC 15 PO) Take 1 tablet by mouth daily.     No current facility-administered medications for this visit.     ALLERGIES: Patient has no known allergies.  Family History  Problem Relation Age of  Onset  . Hypertension Mother   . Hyperlipidemia Mother   . Hyperlipidemia Father   . Hypertension Father   . Thyroid disease Father        hyperthyroid  . Thyroid disease Sister        hyperthyroid  . Stroke Paternal Grandmother   . Heart attack Paternal Grandfather     Social History   Socioeconomic History  . Marital status: Married    Spouse name: Not on file  . Number of children: Not on file  . Years of education: Not on file  . Highest education level: Not on file  Occupational History  . Not on file  Tobacco Use  . Smoking status: Never Smoker  . Smokeless tobacco: Never Used  Substance and Sexual Activity  . Alcohol use: Never  . Drug use: Never  . Sexual activity: Yes    Birth control/protection: I.U.D.    Comment: paragard inserted in [place]  Other Topics Concern  . Not on file  Social History Narrative  . Not on file   Social Determinants of Health   Financial Resource  Strain:   . Difficulty of Paying Living Expenses:   Food Insecurity:   . Worried About Programme researcher, broadcasting/film/video in the Last Year:   . Barista in the Last Year:   Transportation Needs:   . Freight forwarder (Medical):   Marland Kitchen Lack of Transportation (Non-Medical):   Physical Activity:   . Days of Exercise per Week:   . Minutes of Exercise per Session:   Stress:   . Feeling of Stress :   Social Connections:   . Frequency of Communication with Friends and Family:   . Frequency of Social Gatherings with Friends and Family:   . Attends Religious Services:   . Active Member of Clubs or Organizations:   . Attends Banker Meetings:   Marland Kitchen Marital Status:   Intimate Partner Violence:   . Fear of Current or Ex-Partner:   . Emotionally Abused:   Marland Kitchen Physically Abused:   . Sexually Abused:     Review of Systems  All other systems reviewed and are negative.   PHYSICAL EXAMINATION:    BP 110/76   Pulse 80   Temp 97.6 F (36.4 C) (Temporal)   Ht 5\' 2"  (1.575 m)   Wt  151 lb (68.5 kg)   LMP 01/01/2020 (Approximate)   BMI 27.62 kg/m     General appearance: alert, cooperative and appears stated age   Pelvic 03/02/2020 Uterus no masses.  EMS 6.60 mm.  IUD in correct position.  Ovaries normal.  No adnexal masses.  No free fluid.   ASSESSMENT  Abnormal uterine bleeding.  Resolved.  IUD in correct position.   PLAN  Reassurance regarding US findings and normal position.  We reviewed that her ParaGard IUD does not stop ovulation and ovarian function.  Fu for annual exam an prn.    An After Visit Summary was printed and given to the patient.

## 2020-03-17 DIAGNOSIS — Z13220 Encounter for screening for lipoid disorders: Secondary | ICD-10-CM | POA: Diagnosis not present

## 2020-03-17 DIAGNOSIS — E559 Vitamin D deficiency, unspecified: Secondary | ICD-10-CM | POA: Diagnosis not present

## 2020-03-17 DIAGNOSIS — Z79899 Other long term (current) drug therapy: Secondary | ICD-10-CM | POA: Diagnosis not present

## 2020-03-17 DIAGNOSIS — R5383 Other fatigue: Secondary | ICD-10-CM | POA: Diagnosis not present

## 2020-03-17 DIAGNOSIS — Z0000 Encounter for general adult medical examination without abnormal findings: Secondary | ICD-10-CM | POA: Diagnosis not present

## 2020-05-19 ENCOUNTER — Telehealth: Payer: Self-pay | Admitting: Cardiology

## 2020-05-19 NOTE — Telephone Encounter (Signed)
Patient states she got a letter in the mail from [facility]. She states she wore her monitor in July and they are asking for more information to charge her insurance. She states their number is: [phone], account number: [id number]

## 2020-06-10 DIAGNOSIS — Z23 Encounter for immunization: Secondary | ICD-10-CM | POA: Diagnosis not present

## 2020-07-04 DIAGNOSIS — Z20822 Contact with and (suspected) exposure to covid-19: Secondary | ICD-10-CM | POA: Diagnosis not present

## 2020-07-04 DIAGNOSIS — R059 Cough, unspecified: Secondary | ICD-10-CM | POA: Diagnosis not present

## 2020-07-04 DIAGNOSIS — Z20828 Contact with and (suspected) exposure to other viral communicable diseases: Secondary | ICD-10-CM | POA: Diagnosis not present

## 2020-07-05 DIAGNOSIS — R059 Cough, unspecified: Secondary | ICD-10-CM | POA: Diagnosis not present

## 2020-07-05 DIAGNOSIS — Z20828 Contact with and (suspected) exposure to other viral communicable diseases: Secondary | ICD-10-CM | POA: Diagnosis not present

## 2020-08-20 DIAGNOSIS — R221 Localized swelling, mass and lump, neck: Secondary | ICD-10-CM | POA: Diagnosis not present

## 2020-09-09 DIAGNOSIS — F411 Generalized anxiety disorder: Secondary | ICD-10-CM | POA: Diagnosis not present

## 2020-09-09 DIAGNOSIS — M542 Cervicalgia: Secondary | ICD-10-CM | POA: Diagnosis not present

## 2020-10-31 DIAGNOSIS — J069 Acute upper respiratory infection, unspecified: Secondary | ICD-10-CM | POA: Diagnosis not present

## 2020-11-05 ENCOUNTER — Telehealth: Payer: Self-pay | Admitting: *Deleted

## 2020-11-05 ENCOUNTER — Other Ambulatory Visit: Payer: Self-pay | Admitting: Obstetrics and Gynecology

## 2020-11-05 DIAGNOSIS — Z1231 Encounter for screening mammogram for malignant neoplasm of breast: Secondary | ICD-10-CM

## 2020-11-05 NOTE — Telephone Encounter (Signed)
Patient called c/o breast pain in left breast and new lumps found in breast called to schedule her mammogram at the [facility] center of [place]. Patient mentioned breast problem and pain. I advised her office protocol and she will need to schedule appointment with Dr.Silva for breast exam. Patient declined and said she will check with her PCP to see if he will approve orders without an office visit.

## 2020-11-06 DIAGNOSIS — J019 Acute sinusitis, unspecified: Secondary | ICD-10-CM | POA: Diagnosis not present

## 2020-11-25 IMAGING — CT CT ANGIOGRAPHY CHEST
2 of 7 series · 19 of 46 positions shown · IV contrast (APPLIED)
Comparison: None.

CLINICAL DATA: Tachycardia. Intermediate probability for pulmonary
embolism.

EXAM:
CT ANGIOGRAPHY CHEST WITH CONTRAST
TECHNIQUE: Multidetector CT imaging of the chest was performed using the
standard protocol during bolus administration of intravenous
contrast. Multiplanar CT image reconstructions and MIPs were
obtained to evaluate the vascular anatomy.
CONTRAST:  80mL OMNIPAQUE IOHEXOL 350 MG/ML SOLN

[Series 7: thins · axial · 0.77mm/px · z∈[+1131,+1412]mm · 16 of 454 slices shown]
[im 26/454  lung]
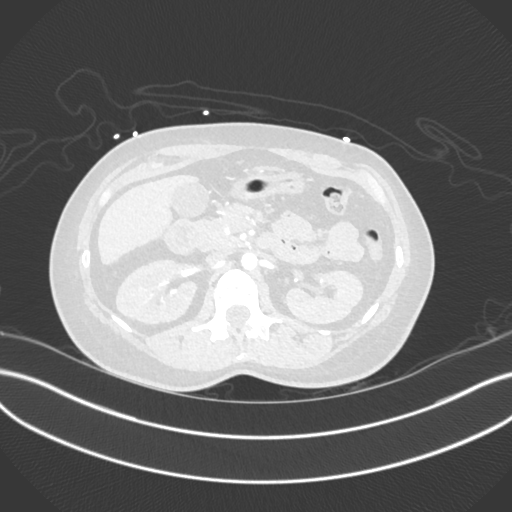
[im 51/454  soft-tissue]
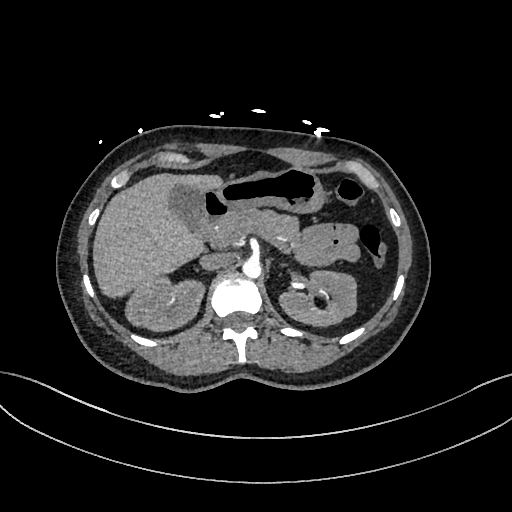
[im 76/454  lung]
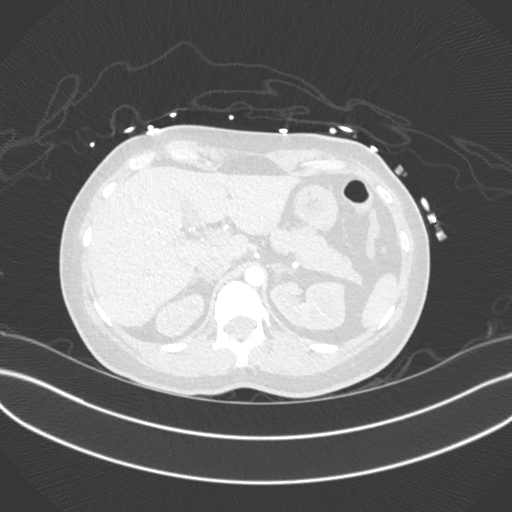
[im 101/454  soft-tissue]
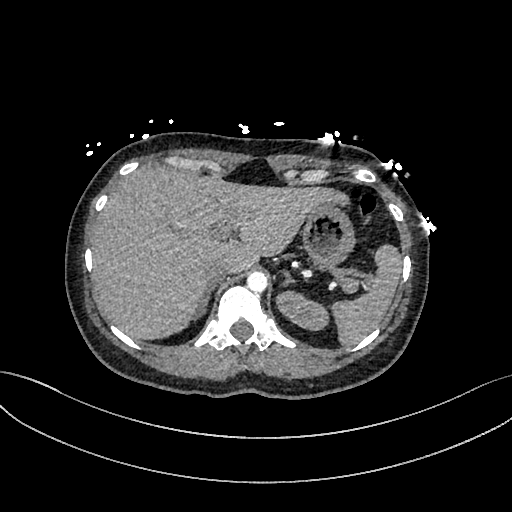
[im 126/454  lung]
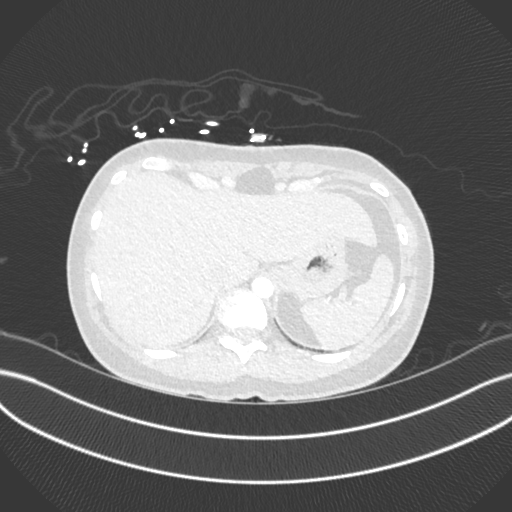
[im 152/454  soft-tissue]
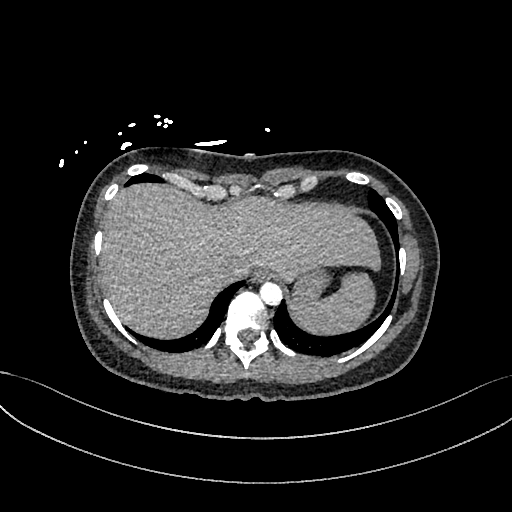
[im 177/454  lung]
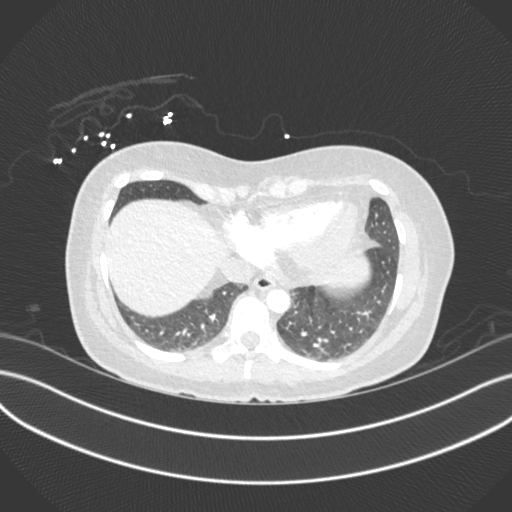
[im 202/454  soft-tissue]
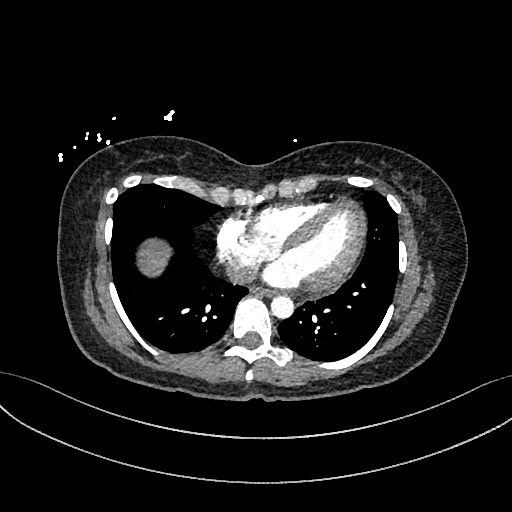
[im 252/454  lung]
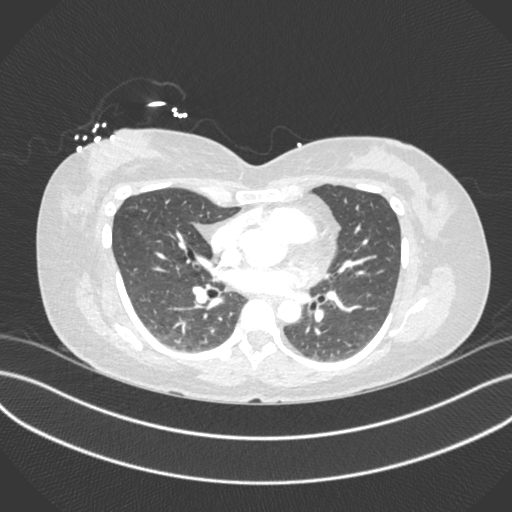
[im 277/454  soft-tissue]
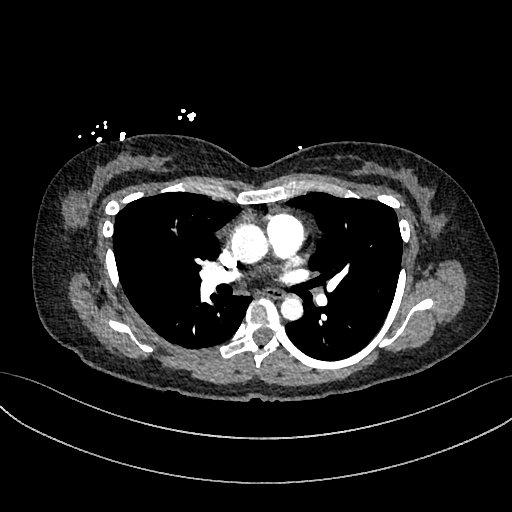
[im 303/454  lung]
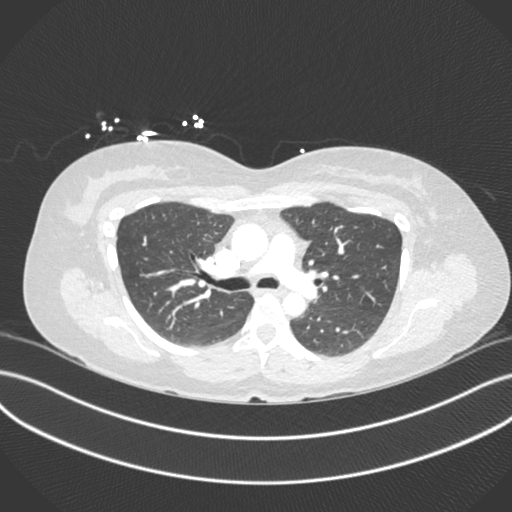
[im 328/454  soft-tissue]
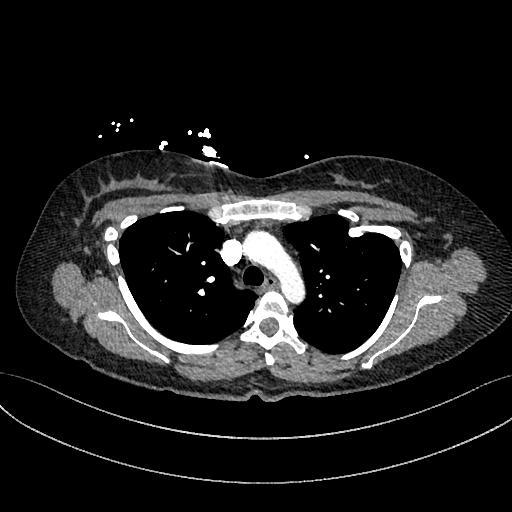
[im 353/454  lung]
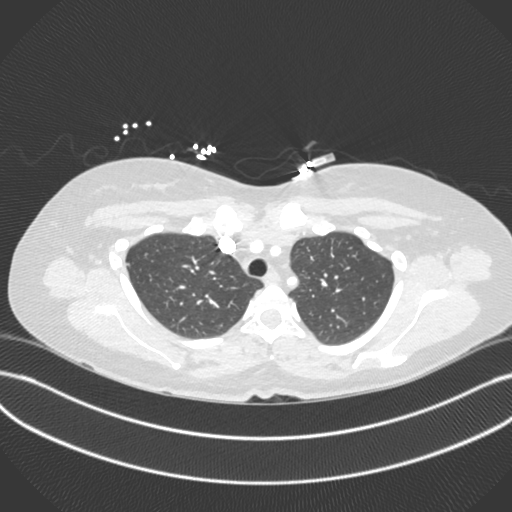
[im 378/454  soft-tissue]
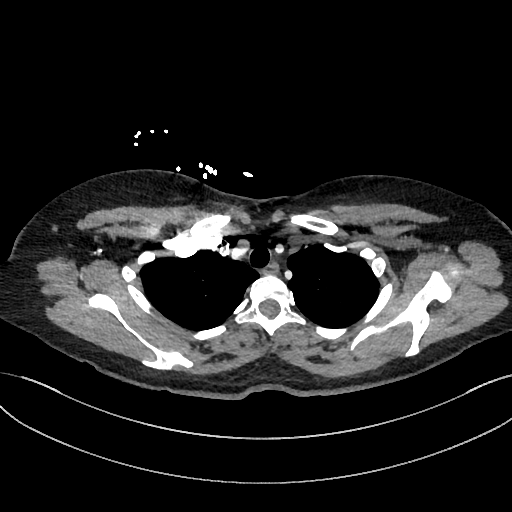
[im 403/454  lung]
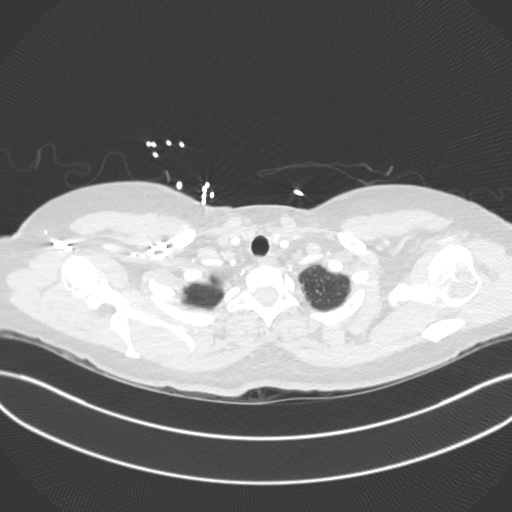
[im 428/454  soft-tissue]
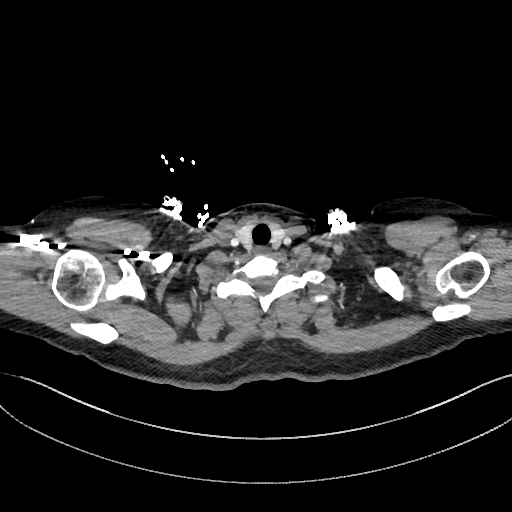

[Series 8: cor · coronal · 0.63mm/px · 3 of 117 slices shown]
[im 30/117  soft-tissue]
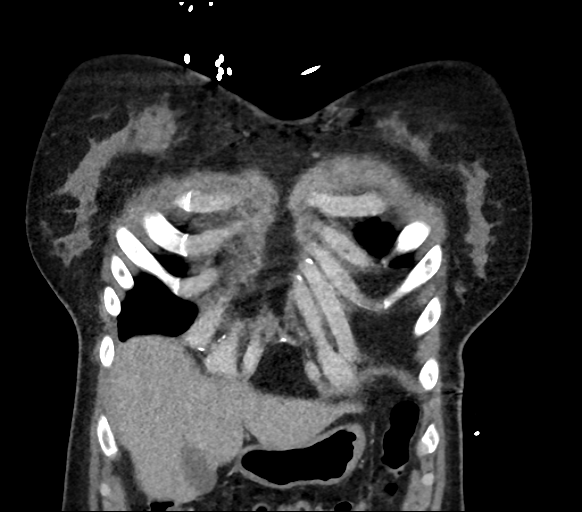
[im 59/117  soft-tissue]
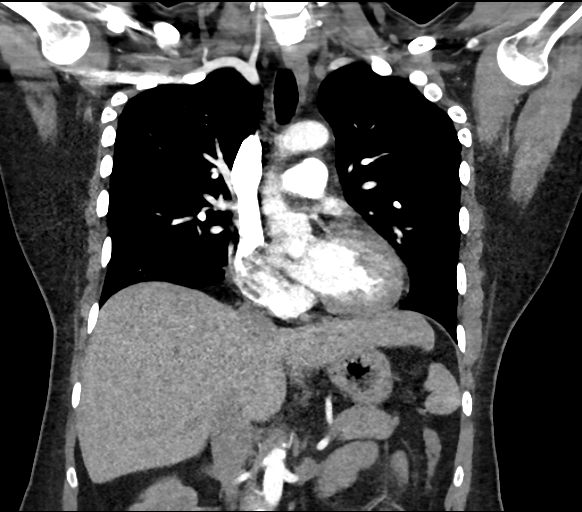
[im 88/117  soft-tissue]
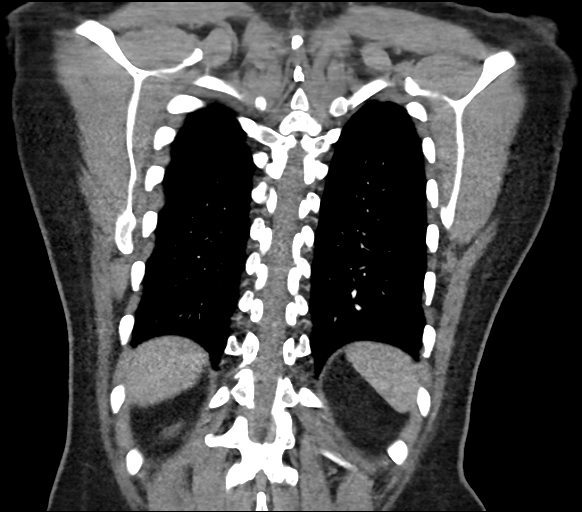

[19 of 46 positions shown; findings below may reference images not displayed]

FINDINGS: Cardiovascular: Normal heart size. No pericardial effusion. Negative
for pulmonary embolism. Negative aorta

Mediastinum/Nodes: Negative for adenopathy or mass

Lungs/Pleura: There is no edema, consolidation, effusion, or
pneumothorax.

Upper Abdomen: Negative

Musculoskeletal: Pectus excavatum which is mild.

Review of the MIP images confirms the above findings.
IMPRESSION: Negative chest CTA.

## 2020-11-26 ENCOUNTER — Ambulatory Visit: Payer: BC Managed Care – PPO | Admitting: Obstetrics and Gynecology

## 2020-11-26 ENCOUNTER — Telehealth: Payer: Self-pay | Admitting: Obstetrics and Gynecology

## 2020-11-26 ENCOUNTER — Encounter: Payer: Self-pay | Admitting: Obstetrics and Gynecology

## 2020-11-26 ENCOUNTER — Other Ambulatory Visit: Payer: Self-pay

## 2020-11-26 VITALS — BP 118/70 | HR 76 | Ht 62.0 in | Wt 154.0 lb

## 2020-11-26 DIAGNOSIS — R2232 Localized swelling, mass and lump, left upper limb: Secondary | ICD-10-CM | POA: Diagnosis not present

## 2020-11-26 DIAGNOSIS — N760 Acute vaginitis: Secondary | ICD-10-CM

## 2020-11-26 DIAGNOSIS — N644 Mastodynia: Secondary | ICD-10-CM

## 2020-11-26 NOTE — Telephone Encounter (Signed)
Patient scheduled at the breast center on 12/29/20 @ 7:20am patient was she can call daily/weekly to check for cancellations.

## 2020-11-26 NOTE — Telephone Encounter (Signed)
Please schedule a diagnostic bilateral mammogram and left breast ultrasound at the Breast Center.   She has left breast pain and has a 5 mm left axillary node on examination.   Thank you!

## 2020-11-26 NOTE — Progress Notes (Addendum)
GYNECOLOGY  VISIT   HPI: 45 y.o.   Married  [place]  female   G1P1 with Patient's last menstrual period was 11/12/2020 (approximate).   here for left breast pain.  States it is time for her to have her mammogram and she believes she is due to have a diagnostic mammogram and ultrasound.  She has left lateral breast pain for 3 months, comes and goes. She feels a left axillary node.  She has a small child and has some contract with the breast at times.   Dealing with yeast infections.  She treats with over the counter medication here in the [place].  She has a supply of Diflucan from [place].  She has a yeast infection at least monthly.  States her moother's sisters have breast cancer.   GYNECOLOGIC HISTORY: Patient's last menstrual period was 11/12/2020 (approximate). Contraception: Paragard IUD 07-31-19 Menopausal hormone therapy:  n/a Last mammogram: 10-29-19 3D/Neg/density C/BiRads1 Last pap smear: 07-10-19 Neg:Neg HR HPV,~2018 normal. Uncertain if she had HPV testing.        OB History    Gravida  1   Para  1   Term      Preterm      AB      Living  1     SAB      IAB      Ectopic      Multiple      Live Births                 Patient Active Problem List   Diagnosis Date Noted  . COVID-19 09/10/2019  . Palpitations 03/06/2019  . Syncope and collapse 03/06/2019  . Shortness of breath 03/06/2019    Past Medical History:  Diagnosis Date  . Anxiety   . Depression   . Tachycardia    sees cardiologist    Past Surgical History:  Procedure Laterality Date  . CESAREAN SECTION  2015    Current Outpatient Medications  Medication Sig Dispense Refill  . escitalopram (LEXAPRO) 5 MG tablet Take 5 mg by mouth daily.    Marland Kitchen LORazepam (ATIVAN) 0.5 MG tablet 1 tablet at bedtime as needed     No current facility-administered medications for this visit.     ALLERGIES: Patient has no known allergies.  Family History  Problem Relation Age of Onset  .  Hypertension Mother   . Hyperlipidemia Mother   . Hyperlipidemia Father   . Hypertension Father   . Thyroid disease Father        hyperthyroid  . Thyroid disease Sister        hyperthyroid  . Stroke Paternal Grandmother   . Heart attack Paternal Grandfather     Social History   Socioeconomic History  . Marital status: Married    Spouse name: Not on file  . Number of children: Not on file  . Years of education: Not on file  . Highest education level: Not on file  Occupational History  . Not on file  Tobacco Use  . Smoking status: Never Smoker  . Smokeless tobacco: Never Used  Vaping Use  . Vaping Use: Never used  Substance and Sexual Activity  . Alcohol use: Never  . Drug use: Never  . Sexual activity: Yes    Birth control/protection: I.U.D.    Comment: paragard inserted in [place]  Other Topics Concern  . Not on file  Social History Narrative  . Not on file   Social Determinants of Health  Financial Resource Strain: Not on file  Food Insecurity: Not on file  Transportation Needs: Not on file  Physical Activity: Not on file  Stress: Not on file  Social Connections: Not on file  Intimate Partner Violence: Not on file    Review of Systems  Skin:       Left breast pain  All other systems reviewed and are negative.   PHYSICAL EXAMINATION:    BP 118/70   Pulse 76   Ht 5\' 2"  (1.575 m)   Wt 154 lb (69.9 kg)   LMP 11/12/2020 (Approximate)   SpO2 98%   BMI 28.17 kg/m     General appearance: alert, cooperative and appears stated age  Neck: right cervical node enlargement 1.5 cm, supple, symmetrical, trachea midline and thyroid normal to inspection and palpation   Breasts: right -  normal appearance, no masses or tenderness, No nipple retraction or dimpling, No nipple discharge or bleeding, No axillary or supraclavicular adenopathy Left - normal appearance, no masses or tenderness, No nipple retraction or dimpling, No nipple discharge or bleeding, 5 mm left  axillary adenopathy, no supraclavicular adenopathy  Chaperone was present for exam:  11/14/2020  ASSESSMENT  Left breast pain.  Left axillary node 5 mm.  Right cervical node enlargement.  Hx vaginitis.   PLAN  Will proceed with dx bilateral mammogram and left breast ultrasound at the [facility]. She will see her PCP in near future and discuss the cervical node enlargement.  Risk factors for yeast reviewed:  Increased sugar and carbohydrate in diet, increased heat and more restrictive clothing. We discussed a potential diflucan regimen for prophylaxis if recurrent yeast is documented. Return for vaginitis symptoms and for annual exam in June.   27 min total time was spent for this patient encounter, including preparation, face-to-face counseling with the patient, coordination of care, and documentation of the encounter.

## 2020-11-27 DIAGNOSIS — R221 Localized swelling, mass and lump, neck: Secondary | ICD-10-CM | POA: Diagnosis not present

## 2020-11-27 DIAGNOSIS — R590 Localized enlarged lymph nodes: Secondary | ICD-10-CM | POA: Diagnosis not present

## 2020-11-28 DIAGNOSIS — R590 Localized enlarged lymph nodes: Secondary | ICD-10-CM | POA: Diagnosis not present

## 2020-11-28 DIAGNOSIS — R7309 Other abnormal glucose: Secondary | ICD-10-CM | POA: Diagnosis not present

## 2020-11-28 DIAGNOSIS — R221 Localized swelling, mass and lump, neck: Secondary | ICD-10-CM | POA: Diagnosis not present

## 2020-11-30 NOTE — Telephone Encounter (Signed)
Please place in mammogram hold and ok to close encounter.

## 2020-12-02 DIAGNOSIS — F32A Depression, unspecified: Secondary | ICD-10-CM | POA: Insufficient documentation

## 2020-12-02 DIAGNOSIS — F419 Anxiety disorder, unspecified: Secondary | ICD-10-CM | POA: Insufficient documentation

## 2020-12-02 DIAGNOSIS — R000 Tachycardia, unspecified: Secondary | ICD-10-CM | POA: Insufficient documentation

## 2020-12-02 DIAGNOSIS — E559 Vitamin D deficiency, unspecified: Secondary | ICD-10-CM

## 2020-12-02 HISTORY — DX: Vitamin D deficiency, unspecified: E55.9

## 2020-12-05 ENCOUNTER — Ambulatory Visit: Payer: BC Managed Care – PPO | Admitting: Cardiology

## 2020-12-05 ENCOUNTER — Other Ambulatory Visit: Payer: Self-pay

## 2020-12-05 ENCOUNTER — Encounter: Payer: Self-pay | Admitting: Cardiology

## 2020-12-05 VITALS — BP 128/78 | HR 87 | Ht 62.0 in | Wt 153.0 lb

## 2020-12-05 DIAGNOSIS — R55 Syncope and collapse: Secondary | ICD-10-CM

## 2020-12-05 DIAGNOSIS — R002 Palpitations: Secondary | ICD-10-CM

## 2020-12-05 DIAGNOSIS — R0602 Shortness of breath: Secondary | ICD-10-CM

## 2020-12-05 NOTE — Patient Instructions (Signed)

## 2020-12-05 NOTE — Progress Notes (Signed)
Cardiology Office Note:    Date:  12/05/2020   ID:  Jeanne Fitzpatrick, Jeanne Fitzpatrick 12/12/1975, MRN [id number]  PCP:  Farris Has, MD  Cardiologist:  Gypsy Balsam, MD    Referring MD: Farris Has, MD   Chief Complaint  Patient presents with  . Follow-up  Doing fine  History of Present Illness:    Jeanne Fitzpatrick is a 45 y.o. female with past medical history significant for PVCs which were symptomatic She will trigger all investigation otherwise the fact that she did have COVID-19 infection after that she has some difficult time recovering A lot of PVCs, echocardiogram has been done was normal Holter monitor showed some PVCs.  She was given metoprolol on as-needed basis.  She is doing well denies have any palpitations there is no chest pain tightness squeezing pressure burning chest no shortness of breath she said she came today on a yearly follow-up just to make sure everything is fine and things are looking good.  Past Medical History:  Diagnosis Date  . Anxiety   . COVID-19 09/10/2019  . Depression   . Palpitations 03/06/2019  . Shortness of breath 03/06/2019  . Syncope and collapse 03/06/2019  . Tachycardia    sees cardiologist  . Vitamin D deficiency 12/02/2020    Past Surgical History:  Procedure Laterality Date  . CESAREAN SECTION  2015    Current Medications: Current Meds  Medication Sig  . escitalopram (LEXAPRO) 5 MG tablet Take 5 mg by mouth daily.  Marland Kitchen LORazepam (ATIVAN) 0.5 MG tablet Take 0.5 mg by mouth as needed for anxiety.     Allergies:   Patient has no known allergies.   Social History   Socioeconomic History  . Marital status: Married    Spouse name: Not on file  . Number of children: Not on file  . Years of education: Not on file  . Highest education level: Not on file  Occupational History  . Not on file  Tobacco Use  . Smoking status: Never Smoker  . Smokeless tobacco: Never Used  Vaping Use  . Vaping Use: Never used  Substance and  Sexual Activity  . Alcohol use: Never  . Drug use: Never  . Sexual activity: Yes    Birth control/protection: I.U.D.    Comment: paragard inserted in [place]  Other Topics Concern  . Not on file  Social History Narrative  . Not on file   Social Determinants of Health   Financial Resource Strain: Not on file  Food Insecurity: Not on file  Transportation Needs: Not on file  Physical Activity: Not on file  Stress: Not on file  Social Connections: Not on file     Family History: The patient's family history includes Heart attack in her paternal grandfather; Hyperlipidemia in her father and mother; Hypertension in her father and mother; Stroke in her paternal grandmother; Thyroid disease in her father and sister. ROS:   Please see the history of present illness.    All 14 point review of systems negative except as described per history of present illness  EKGs/Labs/Other Studies Reviewed:      Recent Labs: No results found for requested labs within last 8760 hours.  Recent Lipid Panel No results found for: CHOL, TRIG, HDL, CHOLHDL, VLDL, LDLCALC, LDLDIRECT  Physical Exam:    VS:  BP 128/78 (BP Location: Right Arm, Patient Position: Sitting)   Pulse 87   Ht 5\' 2"  (1.575 m)   Wt 153 lb (69.4 kg)  LMP 11/12/2020 (Approximate)   SpO2 96%   BMI 27.98 kg/m     Wt Readings from Last 3 Encounters:  12/05/20 153 lb (69.4 kg)  11/26/20 154 lb (69.9 kg)  01/24/20 151 lb (68.5 kg)     GEN:  Well nourished, well developed in no acute distress HEENT: Normal NECK: No JVD; No carotid bruits LYMPHATICS: No lymphadenopathy CARDIAC: RRR, no murmurs, no rubs, no gallops RESPIRATORY:  Clear to auscultation without rales, wheezing or rhonchi  ABDOMEN: Soft, non-tender, non-distended MUSCULOSKELETAL:  No edema; No deformity  SKIN: Warm and dry LOWER EXTREMITIES: no swelling NEUROLOGIC:  Alert and oriented x 3 PSYCHIATRIC:  Normal affect   ASSESSMENT:    1. Palpitations   2.  Shortness of breath   3. Syncope and collapse    PLAN:    In order of problems listed above:  1. Palpitations practically subsided.  She said viral episodes does not need to take metoprolol I told her she can take it when she needed. 2. Shortness of breath improve basically she is normal. 3. Syncope and collapse not anymore. 4. Dyslipidemia I did calculated her 10 years risk for coronary events which was very low 0.7%.  Therefore there is no need to initiate any medications we did talk about healthy lifestyle need to exercise and good diet.  Her LDL was 134 HDL 54 but again based on those calculations no need to treat   Medication Adjustments/Labs and Tests Ordered: Current medicines are reviewed at length with the patient today.  Concerns regarding medicines are outlined above.  Orders Placed This Encounter  Procedures  . EKG 12-Lead   Medication changes: No orders of the defined types were placed in this encounter.   Signed, Georgeanna Lea, MD, [facility] 12/05/2020 9:18 AM    [facility]

## 2020-12-09 DIAGNOSIS — R221 Localized swelling, mass and lump, neck: Secondary | ICD-10-CM | POA: Diagnosis not present

## 2020-12-19 ENCOUNTER — Other Ambulatory Visit: Payer: Self-pay

## 2020-12-19 ENCOUNTER — Ambulatory Visit
Admission: RE | Admit: 2020-12-19 | Discharge: 2020-12-19 | Disposition: A | Discharge status: Home / Self Care | Payer: BC Managed Care – PPO | Source: Ambulatory Visit | Attending: Obstetrics and Gynecology | Admitting: Obstetrics and Gynecology

## 2020-12-19 DIAGNOSIS — N644 Mastodynia: Secondary | ICD-10-CM

## 2020-12-19 DIAGNOSIS — R2232 Localized swelling, mass and lump, left upper limb: Secondary | ICD-10-CM

## 2020-12-19 DIAGNOSIS — R922 Inconclusive mammogram: Secondary | ICD-10-CM | POA: Diagnosis not present

## 2020-12-29 ENCOUNTER — Other Ambulatory Visit: Payer: BC Managed Care – PPO

## 2021-02-02 NOTE — Progress Notes (Deleted)
45 y.o. G1P1 Married [place] female here for annual exam.    PCP:     No LMP recorded.           Sexually active: {yes no:314532}  The current method of family planning is paragard iud inserted 07-31-2019.    Exercising: {yes no:314532}  {types:19826} Smoker:  {YES NO:22349}  Health Maintenance: Pap:  07-10-2019 neg HPV HR neg History of abnormal Pap:  {YES NO:22349} MMG:  12-19-20 bilateral & left axillary u/s category b density birads 1:neg Colonoscopy:  none BMD:   none  Result  n/a TDaP:  Maybe 2015 with pregnancy Gardasil:   no HIV: maybe during pregnancy Hep C: unsure Screening Labs:  Hb today: ***, Urine today: ***   reports that she has never smoked. She has never used smokeless tobacco. She reports that she does not drink alcohol and does not use drugs.  Past Medical History:  Diagnosis Date  . Anxiety   . COVID-19 09/10/2019  . Depression   . Palpitations 03/06/2019  . Shortness of breath 03/06/2019  . Syncope and collapse 03/06/2019  . Tachycardia    sees cardiologist  . Vitamin D deficiency 12/02/2020    Past Surgical History:  Procedure Laterality Date  . CESAREAN SECTION  2015    Current Outpatient Medications  Medication Sig Dispense Refill  . escitalopram (LEXAPRO) 5 MG tablet Take 5 mg by mouth daily.    Marland Kitchen LORazepam (ATIVAN) 0.5 MG tablet Take 0.5 mg by mouth as needed for anxiety.     No current facility-administered medications for this visit.    Family History  Problem Relation Age of Onset  . Hypertension Mother   . Hyperlipidemia Mother   . Hyperlipidemia Father   . Hypertension Father   . Thyroid disease Father        hyperthyroid  . Thyroid disease Sister        hyperthyroid  . Stroke Paternal Grandmother   . Heart attack Paternal Grandfather     Review of Systems  Exam:   There were no vitals taken for this visit.    General appearance: alert, cooperative and appears stated age Head: normocephalic, without obvious abnormality,  atraumatic Neck: no adenopathy, supple, symmetrical, trachea midline and thyroid normal to inspection and palpation Lungs: clear to auscultation bilaterally Breasts: normal appearance, no masses or tenderness, No nipple retraction or dimpling, No nipple discharge or bleeding, No axillary adenopathy Heart: regular rate and rhythm Abdomen: soft, non-tender; no masses, no organomegaly Extremities: extremities normal, atraumatic, no cyanosis or edema Skin: skin color, texture, turgor normal. No rashes or lesions Lymph nodes: cervical, supraclavicular, and axillary nodes normal. Neurologic: grossly normal  Pelvic: External genitalia:  no lesions              No abnormal inguinal nodes palpated.              Urethra:  normal appearing urethra with no masses, tenderness or lesions              Bartholins and Skenes: normal                 Vagina: normal appearing vagina with normal color and discharge, no lesions              Cervix: no lesions              Pap taken: {yes no:314532} Bimanual Exam:  Uterus:  normal size, contour, position, consistency, mobility, non-tender  Adnexa: no mass, fullness, tenderness              Rectal exam: {yes no:314532}.  Confirms.              Anus:  normal sphincter tone, no lesions  Chaperone was present for exam.  Assessment:   Well woman visit with normal exam.   Plan: Mammogram screening discussed. Self breast awareness reviewed. Pap and HR HPV as above. Guidelines for Calcium, Vitamin D, regular exercise program including cardiovascular and weight bearing exercise.   Follow up annually and prn.   Additional counseling given.  {yes [id number]. _______ minutes face to face time of which over 50% was spent in counseling.    After visit summary provided.

## 2021-02-05 ENCOUNTER — Ambulatory Visit: Payer: BC Managed Care – PPO | Admitting: Obstetrics and Gynecology

## 2021-05-12 DIAGNOSIS — Z0000 Encounter for general adult medical examination without abnormal findings: Secondary | ICD-10-CM | POA: Diagnosis not present

## 2021-05-12 DIAGNOSIS — F411 Generalized anxiety disorder: Secondary | ICD-10-CM | POA: Diagnosis not present

## 2021-05-12 DIAGNOSIS — Z13220 Encounter for screening for lipoid disorders: Secondary | ICD-10-CM | POA: Diagnosis not present

## 2021-05-12 DIAGNOSIS — E559 Vitamin D deficiency, unspecified: Secondary | ICD-10-CM | POA: Diagnosis not present

## 2021-05-12 DIAGNOSIS — Z23 Encounter for immunization: Secondary | ICD-10-CM | POA: Diagnosis not present

## 2021-05-18 NOTE — Progress Notes (Signed)
45 y.o. G1P1 Married Caucasian female here for annual exam.    Occasional pelvic pressure.   Did labs with PCP three weeks ago.  Mildly elevated cholesterol.  PCP:   Farris Has, MD  Patient's last menstrual period was 04/26/2021.     Period Cycle (Days): 28 Period Duration (Days): 7 Period Pattern: Regular Menstrual Flow: Moderate, Light Dysmenorrhea: (!) Mild Dysmenorrhea Symptoms: Cramping     Sexually active: Yes.    The current method of family planning is IUD.   ParaGard 07/31/19. Exercising: No.   Smoker:  no  Health Maintenance: Pap:  07-10-19 normal,  HR HPV neg History of abnormal Pap:  no MMG:  12-19-20 Mammo and U/S normal Colonoscopy:  N/A BMD:   N/A   Result   TDaP:  2015 Gardasil:   no HIV:Neg Hep C:never Screening Labs:  PCP. Flu vaccine:  completed 3 weeks ago. Covid bivalent booster:  she will do.   reports that she has never smoked. She has never used smokeless tobacco. She reports that she does not drink alcohol and does not use drugs.  Past Medical History:  Diagnosis Date   Anxiety    COVID-19 09/10/2019   Depression    Nephrolithiasis    Palpitations 03/06/2019   Shortness of breath 03/06/2019   Syncope and collapse 03/06/2019   Tachycardia    sees cardiologist   Vitamin D deficiency 12/02/2020    Past Surgical History:  Procedure Laterality Date   CESAREAN SECTION  2015    Current Outpatient Medications  Medication Sig Dispense Refill   escitalopram (LEXAPRO) 5 MG tablet Take 5 mg by mouth daily.     LORazepam (ATIVAN) 0.5 MG tablet Take 0.5 mg by mouth as needed for anxiety. (Patient not taking: Reported on 05/22/2021)     No current facility-administered medications for this visit.    Family History  Problem Relation Age of Onset   Hypertension Mother    Hyperlipidemia Mother    Hyperlipidemia Father    Hypertension Father    Thyroid disease Father        hyperthyroid   Thyroid disease Sister        hyperthyroid    Stroke Paternal Grandmother    Heart attack Paternal Grandfather     Review of Systems  All other systems reviewed and are negative.  Exam:   BP 116/74 (Cuff Size: Normal)   Pulse 90   Ht 5\' 2"  (1.575 m)   Wt 145 lb (65.8 kg)   LMP 04/26/2021   SpO2 98%   BMI 26.52 kg/m     General appearance: alert, cooperative and appears stated age Head: normocephalic, without obvious abnormality, atraumatic Neck: no adenopathy, supple, symmetrical, trachea midline and thyroid normal to inspection and palpation Lungs: clear to auscultation bilaterally Breasts: normal appearance, no masses or tenderness, No nipple retraction or dimpling, No nipple discharge or bleeding, No axillary adenopathy Heart: regular rate and rhythm Abdomen: soft, non-tender; no masses, no organomegaly Extremities: extremities normal, atraumatic, no cyanosis or edema Skin: skin color, texture, turgor normal. No rashes or lesions Lymph nodes: cervical, supraclavicular, and axillary nodes normal. Neurologic: grossly normal  Pelvic: External genitalia:  no lesions              No abnormal inguinal nodes palpated.              Urethra:  normal appearing urethra with no masses, tenderness or lesions  Bartholins and Skenes: normal                 Vagina: normal appearing vagina with normal color and discharge, no lesions              Cervix: no lesions.  IUD strings noted.              Pap taken: no Bimanual Exam:  Uterus:  normal size, contour, position, consistency, mobility, non-tender              Adnexa: no mass, fullness, tenderness              Rectal exam: yes.  Confirms.              Anus:  normal sphincter tone, no lesions  Chaperone was present for exam:  Malen Gauze., CMA.  Assessment:   Well woman visit with gynecologic exam. ParaGard IUD.  Plan: Mammogram screening discussed. Self breast awareness reviewed. Pap and HR HPV 2025. Guidelines for Calcium, Vitamin D, regular exercise program  including cardiovascular and weight bearing exercise. Follow up annually and prn.    After visit summary provided.

## 2021-05-22 ENCOUNTER — Ambulatory Visit (INDEPENDENT_AMBULATORY_CARE_PROVIDER_SITE_OTHER): Payer: BC Managed Care – PPO | Admitting: Obstetrics and Gynecology

## 2021-05-22 ENCOUNTER — Other Ambulatory Visit: Payer: Self-pay

## 2021-05-22 ENCOUNTER — Encounter: Payer: Self-pay | Admitting: Obstetrics and Gynecology

## 2021-05-22 VITALS — BP 116/74 | HR 90 | Ht 62.0 in | Wt 145.0 lb

## 2021-05-22 DIAGNOSIS — Z01419 Encounter for gynecological examination (general) (routine) without abnormal findings: Secondary | ICD-10-CM | POA: Diagnosis not present

## 2021-05-22 NOTE — Patient Instructions (Signed)

## 2021-05-26 MED ORDER — METOPROLOL TARTRATE 25 MG PO TABS: 25.0000 mg | ORAL_TABLET | Freq: Two times a day (BID) | ORAL | 3 refills | Status: DC | PRN

## 2021-07-22 DIAGNOSIS — R0981 Nasal congestion: Secondary | ICD-10-CM | POA: Diagnosis not present

## 2021-07-22 DIAGNOSIS — Z20822 Contact with and (suspected) exposure to covid-19: Secondary | ICD-10-CM | POA: Diagnosis not present

## 2021-07-22 DIAGNOSIS — J029 Acute pharyngitis, unspecified: Secondary | ICD-10-CM | POA: Diagnosis not present

## 2021-10-22 DIAGNOSIS — Z9189 Other specified personal risk factors, not elsewhere classified: Secondary | ICD-10-CM | POA: Diagnosis not present

## 2021-10-22 DIAGNOSIS — R42 Dizziness and giddiness: Secondary | ICD-10-CM | POA: Diagnosis not present

## 2021-10-22 DIAGNOSIS — E559 Vitamin D deficiency, unspecified: Secondary | ICD-10-CM | POA: Diagnosis not present

## 2021-11-02 ENCOUNTER — Encounter: Payer: Self-pay | Admitting: Neurology

## 2021-11-02 ENCOUNTER — Ambulatory Visit: Payer: BC Managed Care – PPO | Admitting: Neurology

## 2021-11-02 ENCOUNTER — Other Ambulatory Visit: Payer: Self-pay

## 2021-11-02 VITALS — BP 145/92 | HR 105 | Ht 62.0 in | Wt 143.2 lb

## 2021-11-02 DIAGNOSIS — R55 Syncope and collapse: Secondary | ICD-10-CM

## 2021-11-02 DIAGNOSIS — R42 Dizziness and giddiness: Secondary | ICD-10-CM | POA: Diagnosis not present

## 2021-11-02 NOTE — Progress Notes (Signed)
? ?NEUROLOGY CONSULTATION NOTE ? ?Jeanne Fitzpatrick ?MRN: [id number] ?DOB: 03/02/1976 ? ?Referring provider: Dr. Farris Has ?Primary care provider: Dr. Farris Has ? ?Reason for consult:  dizziness, recurrent syncope ? ?Dear Dr Kateri Plummer: ? ?Thank you for your kind referral of Jeanne Fitzpatrick for consultation of the above symptoms. Although her history is well known to you, please allow me to reiterate it for the purpose of our medical record. She is alone in the office today. Records and images were personally reviewed where available. ? ? ?HISTORY OF PRESENT ILLNESS: ?This is a pleasant 46 year old left-handed woman with a history of PVCs, anxiety, depression, presenting for evaluation of an episode of dizziness and 2 syncopal episodes. The first syncopal episode occurred in 2020, she had been stressed out and was not feeling herself. She recalled having palpitations. She went to bed then woke up in the middle of the night with the urge to use the bathroom. She felt hot and was not feeling okay, she felt like she was about to faint. She went to the bathroom and was sitting on the commode when she passed out for a few seconds. When she came to, she stood up then lost consciousness again. She was evaluated by Cardiology and had a normal echocardiogram and holter monitor showing frequent PVCs, prescribed prn metoprolol. She did well for 2 years until October 2022 when again in the middle of the night, she woke up feeling she was not okay. She had a hot feeling and the urge to have a BM. She was able to talk and alert her husband, and started feeling worse while sitting on the commode having a BM. She then lost consciousness for a few seconds, woke up still sitting up. Her husband did not mention any convulsive activity, she felt shaky for some time after. No tongue bite. She denies any staring/unresponsive episodes. She became concerned about her symptoms when around 15 days ago or so, she was looking at  her cellphone then for 1 second felt like her "mind turned off for a second." Afterwards, she felt a light sensation of constant dizziness and nausea that lasted for 7-9 days. Her friend mentioned she had similar symptoms. Her daughter with autism had been sick with a fever around that time. She denies any further symptoms in the past few days. She denies any olfactory/gustatory hallucinations, deja vu, rising epigastric sensation, focal numbness/tingling/weakness, myoclonic jerks. She has occasional bilateral hand tremors. She has frequent headaches occurring around twice a week, starting in her neck and radiating to the back of her head with no associated nausea/vomiting, photo/phonophobia. She thinks it is related to stress. No other episodes of dizziness prior to recent. No diplopia, dysarthria/dysphagia, bowel/bladder dysfunction. Her memory has never been good, she would not remember a movie she previously watched. This has not worsened recently. A maternal uncle has tremors. She had a normal birth and early development.  There is no history of febrile convulsions, CNS infections such as meningitis/encephalitis, significant traumatic brain injury, neurosurgical procedures, or family history of seizures. ? ? ?PAST MEDICAL HISTORY: ?Past Medical History:  ?Diagnosis Date  ? Anxiety   ? COVID-19 09/10/2019  ? Depression   ? Nephrolithiasis   ? Palpitations 03/06/2019  ? Shortness of breath 03/06/2019  ? Syncope and collapse 03/06/2019  ? Tachycardia   ? sees cardiologist  ? Vitamin D deficiency 12/02/2020  ? ? ?PAST SURGICAL HISTORY: ?Past Surgical History:  ?Procedure Laterality Date  ? CESAREAN SECTION  2015  ? ? ?MEDICATIONS: ?Current Outpatient Medications on File Prior to Visit  ?Medication Sig Dispense Refill  ? escitalopram (LEXAPRO) 5 MG tablet Take 5 mg by mouth daily.    ? LORazepam (ATIVAN) 0.5 MG tablet Take 0.5 mg by mouth as needed for anxiety.    ? metoprolol tartrate (LOPRESSOR) 25 MG tablet Take  1 tablet (25 mg total) by mouth 2 (two) times daily as needed. 180 tablet 3  ? ?No current facility-administered medications on file prior to visit.  ? ? ?ALLERGIES: ?No Known Allergies ? ?FAMILY HISTORY: ?Family History  ?Problem Relation Age of Onset  ? Hypertension Mother   ? Hyperlipidemia Mother   ? Hyperlipidemia Father   ? Hypertension Father   ? Thyroid disease Father   ?     hyperthyroid  ? Thyroid disease Sister   ?     hyperthyroid  ? Stroke Paternal Grandmother   ? Heart attack Paternal Grandfather   ? ? ?SOCIAL HISTORY: ?Social History  ? ?Socioeconomic History  ? Marital status: Married  ?  Spouse name: Not on file  ? Number of children: Not on file  ? Years of education: Not on file  ? Highest education level: Not on file  ?Occupational History  ? Not on file  ?Tobacco Use  ? Smoking status: Never  ? Smokeless tobacco: Never  ?Vaping Use  ? Vaping Use: Never used  ?Substance and Sexual Activity  ? Alcohol use: Never  ? Drug use: Never  ? Sexual activity: Yes  ?  Birth control/protection: I.U.D.  ?  Comment: paragard inserted in [place]  ?Other Topics Concern  ? Not on file  ?Social History Narrative  ? Left handed   ? ?Social Determinants of Health  ? ?Financial Resource Strain: Not on file  ?Food Insecurity: Not on file  ?Transportation Needs: Not on file  ?Physical Activity: Not on file  ?Stress: Not on file  ?Social Connections: Not on file  ?Intimate Partner Violence: Not on file  ? ? ? ?PHYSICAL EXAM: ?Vitals:  ? 11/02/21 0907 11/02/21 0908  ?BP:    ?Pulse:    ?SpO2: 99% 99%  ? ?General: No acute distress ?Head:  Normocephalic/atraumatic ?Skin/Extremities: No rash, no edema ?Neurological Exam: ?Mental status: alert and oriented to person, place, and time, no dysarthria or aphasia, Fund of knowledge is appropriate.  Recent and remote memory are intact, 3/3 delayed recall.  Attention and concentration are normal, 5/5 WORLD backwards.  ?Cranial nerves: ?CN I: not tested ?CN II: pupils equal, round  and reactive to light, visual fields intact ?CN III, IV, VI:  full range of motion, no nystagmus, no ptosis ?CN V: facial sensation intact ?CN VII: upper and lower face symmetric ?CN VIII: hearing intact to conversation ?Bulk & Tone: normal, no fasciculations. ?Motor: 5/5 throughout with no pronator drift. ?Sensation: intact to light touch, cold, pin, vibration sense.  No extinction to double simultaneous stimulation.  Romberg test negative ?Deep Tendon Reflexes: brisk +2 throughout, no ankle clonus, negative Hoffman sign ?Cerebellar: no incoordination on finger to nose testing ?Gait: narrow-based and steady, able to tandem walk adequately. ?Tremor: there was brief postural tremor when she demonstrated that she has tremors, but none on exam ? ? ?IMPRESSION: ?This is a pleasant 46 year old left-handed woman with a history of PVCs, anxiety, depression, presenting for evaluation of an episode of dizziness with very brief note of "mind turned off for a second," and 2 syncopal episodes. Her neurological exam is  normal. Recent episode of dizziness was also experienced by her friend, lending to a possible viral etiology as suspected by her PCP. The syncopal episodes are suggestive of vasovagal syncope, however since the episodes woke her up from sleep, we discussed doing a brain MRI with and without contrast and 1-hour EEG to further classify her symptoms. If tests are normal, follow-up as needed, call for any changes.  ? ? ?Thank you for allowing me to participate in the care of this patient. Please do not hesitate to call for any questions or concerns. ? ? ?Patrcia Dolly, M.D. ? ?CC: Dr. Kateri Plummer ? ?

## 2021-11-02 NOTE — Progress Notes (Signed)
Pt has PRN metoprolol last taken 6 months ago.  ?

## 2021-11-02 NOTE — Patient Instructions (Addendum)
Good to meet you. ? ?Schedule open MRI brain with and without contrast ? ?2. Schedule 1-hour EEG ? ?Our office will call with results, if normal, follow-up as needed. Call for any changes. ?

## 2021-11-09 ENCOUNTER — Other Ambulatory Visit: Payer: Self-pay

## 2021-11-09 ENCOUNTER — Ambulatory Visit: Payer: BC Managed Care – PPO | Admitting: Neurology

## 2021-11-09 DIAGNOSIS — R55 Syncope and collapse: Secondary | ICD-10-CM | POA: Diagnosis not present

## 2021-11-09 DIAGNOSIS — R42 Dizziness and giddiness: Secondary | ICD-10-CM | POA: Diagnosis not present

## 2021-11-10 ENCOUNTER — Encounter (INDEPENDENT_AMBULATORY_CARE_PROVIDER_SITE_OTHER): Payer: Self-pay

## 2021-11-12 DIAGNOSIS — R55 Syncope and collapse: Secondary | ICD-10-CM | POA: Diagnosis not present

## 2021-11-13 ENCOUNTER — Telehealth: Payer: Self-pay

## 2021-11-13 NOTE — Telephone Encounter (Signed)
Pt called an informed brain MRI looks good, no tumor, stroke, or bleed ?

## 2021-11-13 NOTE — Telephone Encounter (Signed)
-----   Message from Van Clines, MD sent at 11/13/2021  3:31 PM EDT ----- ?Regarding: MRI results ?Pls let her know brain MRI looks good, no tumor, stroke, or bleed, thanks ? ?----- Message ----- ?From: Cordero, Danna W ?Sent: 11/13/2021   3:21 PM EDT ?To: Van Clines, MD ? ? ?

## 2021-11-23 ENCOUNTER — Other Ambulatory Visit: Payer: Self-pay | Admitting: Obstetrics and Gynecology

## 2021-11-23 DIAGNOSIS — Z1231 Encounter for screening mammogram for malignant neoplasm of breast: Secondary | ICD-10-CM

## 2021-11-23 NOTE — Procedures (Signed)
ELECTROENCEPHALOGRAM REPORT ? ?Date of Study: 11/09/2021 ? ?Patient's Name: Jeanne Fitzpatrick ?MRN: [id number] ?Date of Birth: 04-Feb-1976 ? ?Referring Provider: Dr. Ellouise Newer ? ?Clinical History: This is a 46 year old woman with syncope and episode of dizziness with very brief instance where "mind turned off for a second." EEG for classification. ? ?Medications: ?LEXAPRO 5 MG tablet ?ATIVAN 0.5 MG tablet ?LOPRESSOR 25 MG tablet ? ?Technical Summary: ?A multichannel digital 1-hour EEG recording measured by the international 10-20 system with electrodes applied with paste and impedances below 5000 ohms performed in our laboratory with EKG monitoring in an awake and drowsy patient.  Hyperventilation was not performed. Photic stimulation was performed.  The digital EEG was referentially recorded, reformatted, and digitally filtered in a variety of bipolar and referential montages for optimal display.   ? ?Description: ?The patient is awake and drowsy during the recording.  During maximal wakefulness, there is a symmetric, medium voltage 11 Hz posterior dominant rhythm that attenuates with eye opening.  The record is symmetric.  During drowsiness, there is an increase in theta slowing of the background.  Vertex waves and symmetric sleep spindles were seen.  Photic stimulation did not elicit any abnormalities.  There were no epileptiform discharges or electrographic seizures seen.   ? ?EKG lead was unremarkable. ? ?Impression: ?This 1-hour awake and drowsy EEG is normal.   ? ?Clinical Correlation: ?A normal EEG does not exclude a clinical diagnosis of epilepsy.  If further clinical questions remain, prolonged EEG may be helpful.  Clinical correlation is advised. ? ? ?Ellouise Newer, M.D. ? ?

## 2021-11-24 ENCOUNTER — Telehealth: Payer: Self-pay

## 2021-11-24 NOTE — Telephone Encounter (Signed)
Pt called an informed EEG is normal  ?

## 2021-11-24 NOTE — Telephone Encounter (Signed)
-----   Message from Cameron Sprang, MD sent at 11/23/2021  2:26 PM EDT ----- ?Pls let her know EEG is normal, thanks ?

## 2021-12-21 ENCOUNTER — Ambulatory Visit
Admission: RE | Admit: 2021-12-21 | Discharge: 2021-12-21 | Disposition: A | Discharge status: Home / Self Care | Payer: BC Managed Care – PPO | Source: Ambulatory Visit | Attending: Obstetrics and Gynecology | Admitting: Obstetrics and Gynecology

## 2021-12-21 DIAGNOSIS — Z1231 Encounter for screening mammogram for malignant neoplasm of breast: Secondary | ICD-10-CM | POA: Diagnosis not present

## 2021-12-22 ENCOUNTER — Other Ambulatory Visit: Payer: Self-pay | Admitting: Obstetrics and Gynecology

## 2021-12-22 DIAGNOSIS — R928 Other abnormal and inconclusive findings on diagnostic imaging of breast: Secondary | ICD-10-CM

## 2021-12-24 ENCOUNTER — Telehealth: Payer: Self-pay | Admitting: *Deleted

## 2021-12-24 NOTE — Telephone Encounter (Signed)
Patient called received a call back from the breast center needing additional imaging. Patient has screening mammogram on 12/21/21, now scheduled on 12/28/21 for left diag mammogram and left breast ultrasound. Patient informed of results and the reason for follow up. ?

## 2021-12-28 ENCOUNTER — Ambulatory Visit
Admission: RE | Admit: 2021-12-28 | Discharge: 2021-12-28 | Disposition: A | Discharge status: Home / Self Care | Payer: BC Managed Care – PPO | Source: Ambulatory Visit | Attending: Obstetrics and Gynecology | Admitting: Obstetrics and Gynecology

## 2021-12-28 DIAGNOSIS — N6012 Diffuse cystic mastopathy of left breast: Secondary | ICD-10-CM | POA: Diagnosis not present

## 2021-12-28 DIAGNOSIS — R928 Other abnormal and inconclusive findings on diagnostic imaging of breast: Secondary | ICD-10-CM

## 2021-12-31 ENCOUNTER — Other Ambulatory Visit: Payer: BC Managed Care – PPO

## 2022-03-08 DIAGNOSIS — R051 Acute cough: Secondary | ICD-10-CM | POA: Diagnosis not present

## 2022-03-08 DIAGNOSIS — R509 Fever, unspecified: Secondary | ICD-10-CM | POA: Diagnosis not present

## 2022-03-08 DIAGNOSIS — J029 Acute pharyngitis, unspecified: Secondary | ICD-10-CM | POA: Diagnosis not present

## 2022-03-09 ENCOUNTER — Ambulatory Visit: Payer: BC Managed Care – PPO | Admitting: Cardiology

## 2022-03-11 ENCOUNTER — Ambulatory Visit: Payer: BC Managed Care – PPO | Admitting: Cardiology

## 2022-03-11 ENCOUNTER — Encounter: Payer: Self-pay | Admitting: Cardiology

## 2022-03-11 VITALS — BP 134/98 | HR 124 | Ht 63.0 in | Wt 145.0 lb

## 2022-03-11 DIAGNOSIS — R0602 Shortness of breath: Secondary | ICD-10-CM

## 2022-03-11 DIAGNOSIS — R55 Syncope and collapse: Secondary | ICD-10-CM

## 2022-03-11 DIAGNOSIS — R002 Palpitations: Secondary | ICD-10-CM

## 2022-03-11 NOTE — Patient Instructions (Signed)
Medication Instructions:  Your physician recommends that you continue on your current medications as directed. Please refer to the Current Medication list given to you today.  *If you need a refill on your cardiac medications before your next appointment, please call your pharmacy*   Lab Work: None If you have labs (blood work) drawn today and your tests are completely normal, you will receive your results only by: MyChart Message (if you have MyChart) OR A paper copy in the mail If you have any lab test that is abnormal or we need to change your treatment, we will call you to review the results.   Testing/Procedures: None   Follow-Up: At [facility], you and your health needs are our priority.  As part of our continuing mission to provide you with exceptional heart care, we have created designated Provider Care Teams.  These Care Teams include your primary Cardiologist (physician) and Advanced Practice Providers (APPs -  Physician Assistants and Nurse Practitioners) who all work together to provide you with the care you need, when you need it.  We recommend signing up for the patient portal called "MyChart".  Sign up information is provided on this After Visit Summary.  MyChart is used to connect with patients for Virtual Visits (Telemedicine).  Patients are able to view lab/test results, encounter notes, upcoming appointments, etc.  Non-urgent messages can be sent to your provider as well.   To learn more about what you can do with MyChart, go to [email or web address].    Your next appointment:   1 year(s)  The format for your next appointment:   In Person  Provider:   Robert Krasowski, MD    Other Instructions   Important Information About Sugar       

## 2022-03-11 NOTE — Progress Notes (Signed)
Cardiology Office Note:    Date:  03/11/2022   ID:  Jeanne Fitzpatrick [place], DOB 1975/11/05, MRN [id number]  PCP:  Farris Has, MD  Cardiologist:  Gypsy Balsam, MD    Referring MD: Farris Has, MD   Chief Complaint  Patient presents with   yearly follow up    History of Present Illness:    Jeanne Fitzpatrick is a 46 y.o. female with a past medical history significant for vasovagal syncope, cardiac work-up so far included monitor which showed no significant arrhythmia as well as echocardiogram which showed preserved normal left ventricle ejection fraction, also borderline essential hypertension. She comes to me for yearly follow-up since I seen her last time she had 1 more episode of syncope again looked vagal it was in the middle of the night she started having some cramping in her belly she does have some lactose intolerance and she thinks she made it some dietary mistakes she went to the restroom to have a bowel movement and passed out.  After that she been seen by neurology I did review the note.  EEG MRI negative.  Since that time she seems to be doing well for last week however she has been having cold-like symptoms she has been coughing she was seen by urgent care she was told this is viral but she is not getting better and she is trying to go to urgent care today again  Past Medical History:  Diagnosis Date   Anxiety    COVID-19 09/10/2019   Depression    Nephrolithiasis    Palpitations 03/06/2019   Shortness of breath 03/06/2019   Syncope and collapse 03/06/2019   Tachycardia    sees cardiologist   Vitamin D deficiency 12/02/2020    Past Surgical History:  Procedure Laterality Date   CESAREAN SECTION  2015    Current Medications: Current Meds  Medication Sig   escitalopram (LEXAPRO) 5 MG tablet Take 5 mg by mouth daily.   LORazepam (ATIVAN) 0.5 MG tablet Take 0.5 mg by mouth as needed for anxiety.   metoprolol tartrate (LOPRESSOR) 25 MG tablet Take 1  tablet (25 mg total) by mouth 2 (two) times daily as needed. (Patient taking differently: Take 25 mg by mouth 2 (two) times daily as needed (Elevated HR).)     Allergies:   Patient has no known allergies.   Social History   Socioeconomic History   Marital status: Married    Spouse name: Not on file   Number of children: Not on file   Years of education: Not on file   Highest education level: Not on file  Occupational History   Not on file  Tobacco Use   Smoking status: Never   Smokeless tobacco: Never  Vaping Use   Vaping Use: Never used  Substance and Sexual Activity   Alcohol use: Never   Drug use: Never   Sexual activity: Yes    Birth control/protection: I.U.D.    Comment: paragard inserted in [place]  Other Topics Concern   Not on file  Social History Narrative   Left handed    Social Determinants of Health   Financial Resource Strain: Not on file  Food Insecurity: Not on file  Transportation Needs: Not on file  Physical Activity: Not on file  Stress: Not on file  Social Connections: Not on file     Family History: The patient's family history includes Heart attack in her paternal grandfather; Hyperlipidemia in her father and mother; Hypertension in her father  and mother; Stroke in her paternal grandmother; Thyroid disease in her father and sister. ROS:   Please see the history of present illness.    All 14 point review of systems negative except as described per history of present illness  EKGs/Labs/Other Studies Reviewed:      Recent Labs: No results found for requested labs within last 365 days.  Recent Lipid Panel No results found for: "CHOL", "TRIG", "HDL", "CHOLHDL", "VLDL", "LDLCALC", "LDLDIRECT"  Physical Exam:    VS:  BP (!) 134/98 (BP Location: Left Arm, Patient Position: Sitting)   Pulse (!) 124   Ht 5\' 3"  (1.6 m)   Wt 145 lb (65.8 kg)   SpO2 99%   BMI 25.69 kg/m     Wt Readings from Last 3 Encounters:  03/11/22 145 lb (65.8 kg)   11/02/21 143 lb 3.2 oz (65 kg)  05/22/21 145 lb (65.8 kg)     GEN:  Well nourished, well developed in no acute distress HEENT: Normal NECK: No JVD; No carotid bruits LYMPHATICS: No lymphadenopathy CARDIAC: RRR, no murmurs, no rubs, no gallops RESPIRATORY:  Clear to auscultation without rales, wheezing or rhonchi  ABDOMEN: Soft, non-tender, non-distended MUSCULOSKELETAL:  No edema; No deformity  SKIN: Warm and dry LOWER EXTREMITIES: no swelling NEUROLOGIC:  Alert and oriented x 3 PSYCHIATRIC:  Normal affect   ASSESSMENT:    1. Palpitations   2. Syncope and collapse   3. Shortness of breath    PLAN:    In order of problems listed above:  Vasovagal syncope again we discussed the mechanism of this phenomenon.  I asked her to stay well-hydrated.  We did talk about counterpressure maneuvers.  She will stay well-hydrated. Palpitations denies having any Shortness of breath better.  However now she is sick with some viral maybe even bacterial illness right now is planning to go to urgent care today. Hypertension elevated today.  However today she is sick when asked about blood pressure at home she said anytime she check her blood pressure usually little elevated.  I asked her to check blood pressure on the regular basis every single day for 2 weeks however I wanted to wait for about 2 weeks after recovery from her illness and then bring results to me and sent results to me so we can decide about therapy.  She did not have LVH on the echocardiogram she does not have LVH on EKG today.   Medication Adjustments/Labs and Tests Ordered: Current medicines are reviewed at length with the patient today.  Concerns regarding medicines are outlined above.  Orders Placed This Encounter  Procedures   EKG 12-Lead   Medication changes: No orders of the defined types were placed in this encounter.   Signed, 05/24/21, MD, [facility] 03/11/2022 11:50 AM    [facility]

## 2022-03-12 DIAGNOSIS — J069 Acute upper respiratory infection, unspecified: Secondary | ICD-10-CM | POA: Diagnosis not present

## 2022-03-16 DIAGNOSIS — J988 Other specified respiratory disorders: Secondary | ICD-10-CM | POA: Diagnosis not present

## 2022-03-16 DIAGNOSIS — R030 Elevated blood-pressure reading, without diagnosis of hypertension: Secondary | ICD-10-CM | POA: Diagnosis not present

## 2022-03-16 DIAGNOSIS — R059 Cough, unspecified: Secondary | ICD-10-CM | POA: Diagnosis not present

## 2022-05-20 NOTE — Progress Notes (Signed)
46 y.o. G1P1 Married Caucasian [place] female here for annual exam.    Occasional left breast discomfort--See mammogram report. She has fibrocystic change.  She likes to drink Coke.  Some PMS. Takes Lexapro 5 mg daily.   Having stress as she is waiting for her [employer].   Sees a cardiologist for tachycardia.   PCP:  London Pepper, MD   Patient's last menstrual period was 05/20/2022 (exact date).     Period Cycle (Days): 30 Period Duration (Days): 5 Period Pattern: Regular Menstrual Flow: Light Menstrual Control: Maxi pad Dysmenorrhea: None     Sexually active: Yes.    The current method of family planning is IUD--Paragard 07/31/19.    Exercising: No.   Walks 3x/week for past 2 weeks Smoker:  no  Health Maintenance: Pap:  07-10-19 Neg:Neg HR HPV,  ~2355 normal.  Uncertain if she had HPV testing.  History of abnormal Pap:  no MMG: 12-21-21 Lt.Br.poss.mass;Rt.Br.neg. Diag.Lt.Br.w/US--fibrocystic changes/Neg/BiRads2/annual screening. Colonoscopy:  n/a BMD:   n/a  Result  n/a TDaP:  2015 Gardasil:   no HIV: Neg in past Hep C: no Screening Labs:  PCP   reports that she has never smoked. She has never used smokeless tobacco. She reports that she does not drink alcohol and does not use drugs.  Past Medical History:  Diagnosis Date   Anxiety    COVID-19 09/10/2019   Depression    Nephrolithiasis    Palpitations 03/06/2019   Shortness of breath 03/06/2019   Syncope and collapse 03/06/2019   Tachycardia    sees cardiologist   Vitamin D deficiency 12/02/2020    Past Surgical History:  Procedure Laterality Date   CESAREAN SECTION  2015    Current Outpatient Medications  Medication Sig Dispense Refill   escitalopram (LEXAPRO) 5 MG tablet Take 5 mg by mouth daily.     LORazepam (ATIVAN) 0.5 MG tablet Take 0.5 mg by mouth as needed for anxiety.     metoprolol tartrate (LOPRESSOR) 25 MG tablet Take 1 tablet (25 mg total) by mouth 2 (two) times daily as needed.  (Patient taking differently: Take 25 mg by mouth 2 (two) times daily as needed (Elevated HR).) 180 tablet 3   No current facility-administered medications for this visit.    Family History  Problem Relation Age of Onset   Hypertension Mother    Hyperlipidemia Mother    Hyperlipidemia Father    Hypertension Father    Thyroid disease Father        hyperthyroid   Thyroid disease Sister        hyperthyroid   Stroke Paternal Grandmother    Heart attack Paternal Grandfather     Review of Systems  Constitutional:        Left breast discomfort  All other systems reviewed and are negative.   Exam:   BP 132/82   Pulse 99   Ht 5' 2.5" (1.588 m)   Wt 147 lb (66.7 kg)   LMP 05/20/2022 (Exact Date)   SpO2 97%   BMI 26.46 kg/m     General appearance: alert, cooperative and appears stated age Head: normocephalic, without obvious abnormality, atraumatic Neck: no adenopathy, supple, symmetrical, trachea midline and thyroid normal to inspection and palpation Lungs: clear to auscultation bilaterally Breasts: right - normal appearance, no masses or tenderness, No nipple retraction or dimpling, No nipple discharge or bleeding, No axillary adenopathy Left - normal appearance, 1.5 x 1 cm mass at 6:00 - 7:00 just on the edge of the areola,  no tenderness, No nipple retraction or dimpling, No nipple discharge or bleeding, No axillary adenopathy Heart: regular rate and rhythm Abdomen: soft, non-tender; no masses, no organomegaly Extremities: extremities normal, atraumatic, no cyanosis or edema Skin: skin color, texture, turgor normal. No rashes or lesions Lymph nodes: cervical, supraclavicular, and axillary nodes normal. Neurologic: grossly normal  Pelvic: External genitalia:  no lesions              No abnormal inguinal nodes palpated.              Urethra:  normal appearing urethra with no masses, tenderness or lesions              Bartholins and Skenes: normal                 Vagina: normal  appearing vagina with normal color and discharge, no lesions              Cervix: no lesions.  IUD strings noted.               Pap taken: no Bimanual Exam:  Uterus:  normal size, contour, position, consistency, mobility, non-tender              Adnexa: no mass, fullness, tenderness              Rectal exam: yes.  Confirms.              Anus:  normal sphincter tone, no lesions  Chaperone was present for exam:  Marchelle Folks, CMA  Assessment:   Well woman visit with gynecologic exam. ParaGard IUD.  Left breast mass.  Hx fibrocystic breasts. PMS.   Plan: Dx left mammogram and left breast US.  Self breast awareness reviewed. Pap and HR HPV as above. Guidelines for Calcium, Vitamin D, regular exercise program including cardiovascular and weight bearing exercise. I suggested she may want to increase her Lexapro to 10 mg during her premenstrual phase or even daily if she is dealing with a lot of anxiety.  Follow up annually and prn.   After visit summary provided.

## 2022-05-24 ENCOUNTER — Ambulatory Visit (INDEPENDENT_AMBULATORY_CARE_PROVIDER_SITE_OTHER): Payer: BC Managed Care – PPO | Admitting: Obstetrics and Gynecology

## 2022-05-24 ENCOUNTER — Telehealth: Payer: Self-pay | Admitting: Obstetrics and Gynecology

## 2022-05-24 ENCOUNTER — Encounter: Payer: Self-pay | Admitting: Obstetrics and Gynecology

## 2022-05-24 VITALS — BP 132/82 | HR 99 | Ht 62.5 in | Wt 147.0 lb

## 2022-05-24 DIAGNOSIS — Z01419 Encounter for gynecological examination (general) (routine) without abnormal findings: Secondary | ICD-10-CM

## 2022-05-24 DIAGNOSIS — N6325 Unspecified lump in the left breast, overlapping quadrants: Secondary | ICD-10-CM

## 2022-05-24 NOTE — Patient Instructions (Addendum)
EXERCISE AND DIET:  We recommended that you start or continue a regular exercise program for good health. Regular exercise means any activity that makes your heart beat faster and makes you sweat.  We recommend exercising at least 30 minutes per day at least 3 days a week, preferably 4 or 5.  We also recommend a diet low in fat and sugar.  Inactivity, poor dietary choices and obesity can cause diabetes, heart attack, stroke, and kidney damage, among others.    ALCOHOL AND SMOKING:  Women should limit their alcohol intake to no more than 7 drinks/beers/glasses of wine (combined, not each!) per week. Moderation of alcohol intake to this level decreases your risk of breast cancer and liver damage. And of course, no recreational drugs are part of a healthy lifestyle.  And absolutely no smoking or even second hand smoke. Most people know smoking can cause heart and lung diseases, but did you know it also contributes to weakening of your bones? Aging of your skin?  Yellowing of your teeth and nails?  CALCIUM AND VITAMIN D:  Adequate intake of calcium and Vitamin D are recommended.  The recommendations for exact amounts of these supplements seem to change often, but generally speaking 600 mg of calcium (either carbonate or citrate) and 800 units of Vitamin D per day seems prudent. Certain women may benefit from higher intake of Vitamin D.  If you are among these women, your doctor will have told you during your visit.    PAP SMEARS:  Pap smears, to check for cervical cancer or precancers,  have traditionally been done yearly, although recent scientific advances have shown that most women can have pap smears less often.  However, every woman still should have a physical exam from her gynecologist every year. It will include a breast check, inspection of the vulva and vagina to check for abnormal growths or skin changes, a visual exam of the cervix, and then an exam to evaluate the size and shape of the uterus and  ovaries.  And after 44 years of age, a rectal exam is indicated to check for rectal cancers. We will also provide age appropriate advice regarding health maintenance, like when you should have certain vaccines, screening for sexually transmitted diseases, bone density testing, colonoscopy, mammograms, etc.   MAMMOGRAMS:  All women over 40 years old should have a yearly mammogram. Many facilities now offer a "3D" mammogram, which may cost around $50 extra out of pocket. If possible,  we recommend you accept the option to have the 3D mammogram performed.  It both reduces the number of women who will be called back for extra views which then turn out to be normal, and it is better than the routine mammogram at detecting truly abnormal areas.    COLONOSCOPY:  Colonoscopy to screen for colon cancer is recommended for all women at age 50.  We know, you hate the idea of the prep.  We agree, BUT, having colon cancer and not knowing it is worse!!  Colon cancer so often starts as a polyp that can be seen and removed at colonscopy, which can quite literally save your life!  And if your first colonoscopy is normal and you have no family history of colon cancer, most women don't have to have it again for 10 years.  Once every ten years, you can do something that may end up saving your life, right?  We will be happy to help you get it scheduled when you are ready.    Be sure to check your insurance coverage so you understand how much it will cost.  It may be covered as a preventative service at no cost, but you should check your particular policy.      Fibrocystic Breast Changes  Fibrocystic breast changes are changes that can make your breasts swollen, lumpy, or painful. These changes happen when there is scar-like tissue (fibrous tissue) in the breasts or when tiny sacs of fluid (cysts) form in the breast. This is a common condition. It does not mean that you have cancer. It usually happens because of hormone changes  during a monthly period. What are the causes? The exact cause of this condition is not known. However, you are more likely to have it: If you have high levels of female hormones. If your mother had the same condition (inherited). What are the signs or symptoms? Tenderness, swelling, mild discomfort, or pain. Rope-like tissue that can be felt when touching the breast. Lumps in one or both breasts. Changes in breast size. Breasts may get larger before your monthly period and smaller after your period. Discharge from the nipple. Symptoms are usually worse before a monthly period starts. They often get better toward the end of periods. How is this treated? Often, treatment is not needed for this condition. In some cases, you may need treatment, including: Taking medicines. Avoiding caffeine. Reducing the amount of sugar and fat in your diet. You may also have: A procedure to remove fluid from a cyst. Surgery to remove a cyst that is large or tender or does not go away. Medicines that may lower female hormones in the body. Follow these instructions at home: Self-care Check your breasts after every monthly period. If you do not have monthly periods, check your breasts on the first day of every month. Check for: Soreness. New swelling or new lumps. A change in breast size. A change in a lump that was already there. General instructions Take over-the-counter and prescription medicines only as told by your doctor. Wear a support or sports bra that fits well. Wear this support especially when you are exercising. If told by your doctor, avoid or have less caffeine, fat, and sugar in what you eat and drink. Keep all follow-up visits. Contact a doctor if: You have fluid coming from your nipple, especially if the fluid has blood in it. You have new lumps or bumps in your breast. Your breast gets swollen, red, and painful. You have changes in how your breast looks. Your nipple looks flat or it  sinks into your breast. Get help right away if: Your breast turns red, and the redness is spreading. Summary Fibrocystic breast changes are changes that can make your breasts swollen, lumpy, or painful. This condition can happen when you have hormone changes during your monthly period. Check your breasts after every monthly period. If you do not have monthly periods, check your breasts on the first day of every month. This information is not intended to replace advice given to you by your health care provider. Make sure you discuss any questions you have with your health care provider. Document Revised: 10/06/2021 Document Reviewed: 10/06/2021 Elsevier Patient Education  [place].

## 2022-05-24 NOTE — Telephone Encounter (Signed)
Please schedule a dx left mammogram and left breast US at the [facility].  My patient has a 1.5 x 1 cm mass at 6:00 - 7:00.

## 2022-05-25 NOTE — Telephone Encounter (Signed)
Patient scheduled at breast center on 06/02/22 @ 10:10 am

## 2022-05-25 NOTE — Telephone Encounter (Signed)
Patient read my chart message "Last read by [place] Zshoerper Gauthreaux "Jeanne Fitzpatrick" at 11:16 AM on 05/25/2022."

## 2022-05-25 NOTE — Telephone Encounter (Signed)
Encounter reviewed and closed.  

## 2022-06-02 ENCOUNTER — Ambulatory Visit
Admission: RE | Admit: 2022-06-02 | Discharge: 2022-06-02 | Disposition: A | Discharge status: Home / Self Care | Payer: BC Managed Care – PPO | Source: Ambulatory Visit | Attending: Obstetrics and Gynecology | Admitting: Obstetrics and Gynecology

## 2022-06-02 DIAGNOSIS — R922 Inconclusive mammogram: Secondary | ICD-10-CM | POA: Diagnosis not present

## 2022-06-02 DIAGNOSIS — N6325 Unspecified lump in the left breast, overlapping quadrants: Secondary | ICD-10-CM

## 2022-06-16 DIAGNOSIS — F411 Generalized anxiety disorder: Secondary | ICD-10-CM | POA: Diagnosis not present

## 2022-06-16 DIAGNOSIS — Z0000 Encounter for general adult medical examination without abnormal findings: Secondary | ICD-10-CM | POA: Diagnosis not present

## 2022-06-16 DIAGNOSIS — E559 Vitamin D deficiency, unspecified: Secondary | ICD-10-CM | POA: Diagnosis not present

## 2022-06-16 DIAGNOSIS — Z13220 Encounter for screening for lipoid disorders: Secondary | ICD-10-CM | POA: Diagnosis not present

## 2022-06-16 DIAGNOSIS — E785 Hyperlipidemia, unspecified: Secondary | ICD-10-CM | POA: Diagnosis not present

## 2022-06-16 DIAGNOSIS — Z23 Encounter for immunization: Secondary | ICD-10-CM | POA: Diagnosis not present

## 2022-07-20 NOTE — Progress Notes (Unsigned)
GYNECOLOGY  VISIT   HPI: 46 y.o.   Married  Caucasian  female   G1P1 with Jeanne Fitzpatrick's last menstrual period was 07/15/2022.   here for   breast check.  Jeanne Fitzpatrick has fibrocystic change of the left breast.   At her last visit on 05/24/22, her exam was as follows: Breasts: right - normal appearance, no masses or tenderness, No nipple retraction or dimpling, No nipple discharge or bleeding, No axillary adenopathy Left - normal appearance, 1.5 x 1 cm mass at 6:00 - 7:00 just on the edge of the areola, no tenderness, No nipple retraction or dimpling, No nipple discharge or bleeding, No axillary adenopathy  She has had dx imaging of the left breast in May, 2023 and October 2023.   GYNECOLOGIC HISTORY: Jeanne Fitzpatrick's last menstrual period was 07/15/2022. Contraception:  IUD--Paraguard--07/31/19 Menopausal hormone therapy:  n/a Last mammogram:  06/02/22,  Uni Left--Breast Density Category C, BI-RADS CATEGORY 1 Negative     Last pap smear:   07/10/19 negative: HR HPV negative        OB History     Gravida  1   Para  1   Term      Preterm      AB      Living  1      SAB      IAB      Ectopic      Multiple      Live Births                 Jeanne Fitzpatrick Active Problem List   Diagnosis Date Noted   Vitamin D deficiency 12/02/2020   Tachycardia    Depression    COVID-19 09/10/2019   Palpitations 03/06/2019   Syncope and collapse 03/06/2019   Shortness of breath 03/06/2019    Past Medical History:  Diagnosis Date   Anxiety    COVID-19 09/10/2019   Depression    Nephrolithiasis    Palpitations 03/06/2019   Shortness of breath 03/06/2019   Syncope and collapse 03/06/2019   Tachycardia    sees cardiologist   Vitamin D deficiency 12/02/2020    Past Surgical History:  Procedure Laterality Date   CESAREAN SECTION  2015    Current Outpatient Medications  Medication Sig Dispense Refill   escitalopram (LEXAPRO) 5 MG tablet Take 5 mg by mouth daily.     LORazepam (ATIVAN)  0.5 MG tablet Take 0.5 mg by mouth as needed for anxiety.     metoprolol tartrate (LOPRESSOR) 25 MG tablet Take 1 tablet (25 mg total) by mouth 2 (two) times daily as needed. (Jeanne Fitzpatrick taking differently: Take 25 mg by mouth 2 (two) times daily as needed (Elevated HR).) 180 tablet 3   No current facility-administered medications for this visit.     ALLERGIES: Jeanne Fitzpatrick has no known allergies.  Family History  Problem Relation Age of Onset   Hypertension Mother    Hyperlipidemia Mother    Hyperlipidemia Father    Hypertension Father    Thyroid disease Father        hyperthyroid   Thyroid disease Sister        hyperthyroid   Stroke Paternal Grandmother    Heart attack Paternal Grandfather     Social History   Socioeconomic History   Marital status: Married    Spouse name: Not on file   Number of children: Not on file   Years of education: Not on file   Highest education level: Not on file  Occupational  History   Not on file  Tobacco Use   Smoking status: Never   Smokeless tobacco: Never  Vaping Use   Vaping Use: Never used  Substance and Sexual Activity   Alcohol use: Never   Drug use: Never   Sexual activity: Yes    Birth control/protection: I.U.D.    Comment: paragard inserted in [place]--07-31-19  Other Topics Concern   Not on file  Social History Narrative   Left handed    Social Determinants of Health   Financial Resource Strain: Not on file  Food Insecurity: Not on file  Transportation Needs: Not on file  Physical Activity: Not on file  Stress: Not on file  Social Connections: Not on file  Intimate Partner Violence: Not on file    Review of Systems  All other systems reviewed and are negative.   PHYSICAL EXAMINATION:    BP 124/80 (BP Location: Right Arm, Jeanne Fitzpatrick Position: Sitting, Cuff Size: Normal)   Pulse 86   Ht 5\' 2"  (1.575 m)   Wt 146 lb (66.2 kg)   LMP 07/15/2022   SpO2 99%   BMI 26.70 kg/m     General appearance: alert, cooperative and  appears stated age  Breasts: normal appearance, no dominant masses or tenderness, No nipple retraction or dimpling, No nipple discharge or bleeding, No axillary or supraclavicular adenopathy   Chaperone was present for exam:  07/17/2022, RN  ASSESSMENT  Fibrocystic change of the left breast.   PLAN  Imaging reviewed from May 2023 and October 2023.  Reassurance given. Breast cancer screening through self exam, clinical breast exam and mammogram discussed.  Consider reducing caffeine intake.  Next screening mammogram in April, 2024.  Fu prn.   An After Visit Summary was printed and given to the Jeanne Fitzpatrick.

## 2022-07-21 ENCOUNTER — Encounter: Payer: Self-pay | Admitting: Obstetrics and Gynecology

## 2022-07-21 ENCOUNTER — Ambulatory Visit: Payer: BC Managed Care – PPO | Admitting: Obstetrics and Gynecology

## 2022-07-21 VITALS — BP 124/80 | HR 86 | Ht 62.0 in | Wt 146.0 lb

## 2022-07-21 DIAGNOSIS — N6012 Diffuse cystic mastopathy of left breast: Secondary | ICD-10-CM

## 2022-07-21 NOTE — Patient Instructions (Signed)
Fibrocystic Breast Changes  Fibrocystic breast changes are changes in breast tissue that can cause breasts to become swollen, lumpy, or painful. This can happen due to the buildup of scar-like tissue (fibrous tissue) or the forming of fluid-filled sacs (cysts) in the breast. Fibrocystic breast changes can affect one or both breasts. The condition is common, and most often, it is not associated with cancer. The changes usually happen because of hormone changes during a monthly period. What are the causes? The exact cause of fibrocystic breast changes is not known. However, this condition may be: Related to the female hormones estrogen and progesterone. Influenced by family traits that get passed from parent to child (inherited). What are the signs or symptoms? Symptoms of this condition include: Tenderness, swelling, mild discomfort, or pain. Rope-like tissue that can be felt when touching the breast. Lumps in one or both breasts. Changes in breast size. Breasts may get larger before a menstrual period and smaller after a menstrual period. Discharge from the nipple. Symptoms of this condition may affect one or both breasts and are usually worse before menstrual periods start. Symptoms usually get better toward the end of menstrual periods. How is this diagnosed? This condition is diagnosed based on your medical history and a physical exam of your breasts. You may also have tests, such as: A breast X-ray (mammogram). Ultrasound. MRI. Removing a small sample of tissue from the breast for tests (breast biopsy). This may be done if your health care provider thinks that something else may be causing changes in your breasts. How is this treated? Often, treatment is not needed for this condition. In some cases, however, treatment may be needed, including: Taking over-the-counter pain medicines to help relieve pain or discomfort. Limiting or avoiding caffeine. Foods and beverages that contain caffeine  include chocolate, soda, coffee, and tea. Reducing sugar and fat in your diet. Treatment may also include: A procedure to remove fluid from a cyst that is causing pain (fine needle aspiration). Surgery to remove a cyst that is large or tender or does not go away. Medicines that may lower the amount of female hormones. Follow these instructions at home: Self-care Check your breasts after every menstrual period. If you do not have menstrual periods, check your breasts on the first day of every month. Feel for changes in your breasts, such as: More tenderness. A new growth. A change in size. A change in an existing lump. General instructions Take over-the-counter and prescription medicines only as told by your health care provider. Wear a well-fitting support or sports bra, especially when exercising. If told by your health care provider, decrease or avoid caffeine, fat, and sugar in your diet. Keep all follow-up visits. Contact a health care provider if: You have fluid leaking from your nipple, especially if it is bloody. You have new lumps or bumps in your breast. Your breast becomes enlarged, red, and painful. You have areas of your breast that pucker inward. Your nipple appears flat or indented. Get help right away if: You have redness of your breast and the redness is spreading. Summary Fibrocystic breast changes are changes in breast tissue that can cause breasts to become swollen, lumpy, or painful. This condition may be related to the female hormones estrogen and progesterone. With this condition, it is important to examine your breasts after every menstrual period. If you do not have menstrual periods, check your breasts on the first day of every month. This information is not intended to replace advice given to   you by your health care provider. Make sure you discuss any questions you have with your health care provider. Document Revised: 10/06/2021 Document Reviewed:  10/06/2021 Elsevier Patient Education  2023 [employer]  

## 2022-11-08 ENCOUNTER — Other Ambulatory Visit: Payer: Self-pay | Admitting: Obstetrics and Gynecology

## 2022-11-08 DIAGNOSIS — Z1231 Encounter for screening mammogram for malignant neoplasm of breast: Secondary | ICD-10-CM

## 2022-12-16 DIAGNOSIS — R030 Elevated blood-pressure reading, without diagnosis of hypertension: Secondary | ICD-10-CM | POA: Diagnosis not present

## 2022-12-16 DIAGNOSIS — F411 Generalized anxiety disorder: Secondary | ICD-10-CM | POA: Diagnosis not present

## 2022-12-16 DIAGNOSIS — R922 Inconclusive mammogram: Secondary | ICD-10-CM | POA: Diagnosis not present

## 2022-12-21 ENCOUNTER — Ambulatory Visit: Payer: BC Managed Care – PPO | Admitting: Obstetrics and Gynecology

## 2022-12-21 ENCOUNTER — Telehealth: Payer: Self-pay | Admitting: Obstetrics and Gynecology

## 2022-12-21 ENCOUNTER — Encounter: Payer: Self-pay | Admitting: Obstetrics and Gynecology

## 2022-12-21 VITALS — BP 118/76 | HR 76 | Resp 16

## 2022-12-21 DIAGNOSIS — N6324 Unspecified lump in the left breast, lower inner quadrant: Secondary | ICD-10-CM

## 2022-12-21 NOTE — Telephone Encounter (Signed)
[facility].   My patient has a persistent left breast mass 1.5 cm at 7:00.

## 2022-12-21 NOTE — Telephone Encounter (Signed)
Call placed to [facility], spoke with Sue Lush. Screening cancelled. Patient scheduled for Bilateral Dx MMG and L BR [place] on 02/02/23 at 0800.    Spoke with patient, advised as seen above. Patient verbalizes understanding and is agreeable.   Placed in MMG hold.   Routing to provider for final review. Patient is agreeable to disposition. Will close encounter.

## 2022-12-21 NOTE — Progress Notes (Signed)
GYNECOLOGY  VISIT   HPI: 47 y.o.   Married  Caucasian  female   G1P1 with Patient's last menstrual period was 11/24/2022 (exact date).   here for left breast lump.  She thinks that the left breast lump is present of has changed.  She has some pain in the left breast that is very short lived.    Hx fibrocystic change.  She has had a left breast mass 1.5 x 1 cm at 6 - 7:00 at the edge of the areola on 05/24/22.   Her last breast imaging was dx left mammogram and left breast US on 06/02/22 at [facility]. No evidence of malignancy noted.   She is now due for her routine mammogram.   GYNECOLOGIC HISTORY: Patient's last menstrual period was 11/24/2022 (exact date). Contraception:  IUD--Paraguard--07/31/19  Menopausal hormone therapy:  n/a Last mammogram:  bilateral 12-21-21, 09/02/21, [facility] Left--Breast Density Category C, BI-RADS CATEGORY 1 Negative  Last pap smear:   07/10/19 negative: HR HPV negative         OB History     Gravida  1   Para  1   Term      Preterm      AB      Living  1      SAB      IAB      Ectopic      Multiple      Live Births                 Patient Active Problem List   Diagnosis Date Noted   Vitamin D deficiency 12/02/2020   Tachycardia    Depression    COVID-19 09/10/2019   Palpitations 03/06/2019   Syncope and collapse 03/06/2019   Shortness of breath 03/06/2019    Past Medical History:  Diagnosis Date   Anxiety    COVID-19 09/10/2019   Depression    Nephrolithiasis    Palpitations 03/06/2019   Shortness of breath 03/06/2019   Syncope and collapse 03/06/2019   Tachycardia    sees cardiologist   Vitamin D deficiency 12/02/2020    Past Surgical History:  Procedure Laterality Date   CESAREAN SECTION  2015    Current Outpatient Medications  Medication Sig Dispense Refill   escitalopram (LEXAPRO) 5 MG tablet Take 5 mg by mouth daily.     LORazepam (ATIVAN) 0.5 MG tablet Take 0.5 mg by mouth as needed for anxiety.      No current facility-administered medications for this visit.     ALLERGIES: Patient has no known allergies.  Family History  Problem Relation Age of Onset   Hypertension Mother    Hyperlipidemia Mother    Hyperlipidemia Father    Hypertension Father    Thyroid disease Father        hyperthyroid   Thyroid disease Sister        hyperthyroid   Prostate cancer Maternal Uncle    Stroke Paternal Grandmother    Heart attack Paternal Grandfather     Social History   Socioeconomic History   Marital status: Married    Spouse name: Not on file   Number of children: Not on file   Years of education: Not on file   Highest education level: Not on file  Occupational History   Not on file  Tobacco Use   Smoking status: Never   Smokeless tobacco: Never  Vaping Use   Vaping Use: Never used  Substance and Sexual Activity  Alcohol use: Never   Drug use: Never   Sexual activity: Yes    Birth control/protection: I.U.D.    Comment: paragard 07-31-19  Other Topics Concern   Not on file  Social History Narrative   Left handed    Social Determinants of Health   Financial Resource Strain: Not on file  Food Insecurity: Not on file  Transportation Needs: Not on file  Physical Activity: Not on file  Stress: Not on file  Social Connections: Not on file  Intimate Partner Violence: Not on file    Review of Systems  Constitutional: Negative.   HENT: Negative.    Eyes: Negative.   Respiratory: Negative.    Cardiovascular: Negative.   Gastrointestinal: Negative.   Endocrine: Negative.   Genitourinary: Negative.   Musculoskeletal: Negative.   Skin:        Left breast lump  Allergic/Immunologic: Negative.   Neurological: Negative.   Hematological: Negative.   Psychiatric/Behavioral: Negative.      PHYSICAL EXAMINATION:    BP 118/76   Pulse 76   Resp 16   LMP 11/24/2022 (Exact Date)     General appearance: alert, cooperative and appears stated age Head: Normocephalic,  without obvious abnormality, atraumatic Neck: no adenopathy, supple, symmetrical. Normal to inspection and palpation   Breasts: right - normal appearance, no masses or tenderness, No nipple retraction or dimpling, No nipple discharge or bleeding, No axillary or supraclavicular adenopathy Left - normal appearance, 1.5 cm lump at 7:00, no tenderness, No nipple retraction or dimpling, No nipple discharge or bleeding, No axillary or supraclavicular adenopathy  Chaperone was present for exam:  Warren Lacy, CMA  ASSESSMENT  Left breast mass, persistent.  Prior benign imaging.    PLAN  We discussed fibrocystic disease.  Will order bilateral dx mammogram and left breast US at [facility].

## 2022-12-27 ENCOUNTER — Ambulatory Visit: Payer: BC Managed Care – PPO

## 2023-01-18 ENCOUNTER — Ambulatory Visit
Admission: RE | Admit: 2023-01-18 | Discharge: 2023-01-18 | Disposition: A | Discharge status: Home / Self Care | Payer: BC Managed Care – PPO | Source: Ambulatory Visit | Attending: Obstetrics and Gynecology | Admitting: Obstetrics and Gynecology

## 2023-01-18 DIAGNOSIS — R92333 Mammographic heterogeneous density, bilateral breasts: Secondary | ICD-10-CM | POA: Diagnosis not present

## 2023-01-18 DIAGNOSIS — N6002 Solitary cyst of left breast: Secondary | ICD-10-CM | POA: Diagnosis not present

## 2023-01-18 DIAGNOSIS — N6324 Unspecified lump in the left breast, lower inner quadrant: Secondary | ICD-10-CM

## 2023-01-18 DIAGNOSIS — R92332 Mammographic heterogeneous density, left breast: Secondary | ICD-10-CM | POA: Diagnosis not present

## 2023-01-24 ENCOUNTER — Encounter: Payer: Self-pay | Admitting: Obstetrics and Gynecology

## 2023-01-24 DIAGNOSIS — N6325 Unspecified lump in the left breast, overlapping quadrants: Secondary | ICD-10-CM

## 2023-01-24 DIAGNOSIS — N6324 Unspecified lump in the left breast, lower inner quadrant: Secondary | ICD-10-CM

## 2023-01-25 NOTE — Telephone Encounter (Signed)
One good option is Dr Dwain Sarna.

## 2023-01-26 NOTE — Telephone Encounter (Signed)
Etter Sjogren, CMA; [facility] Referrals Hey I spoke with the patient and we agreed on 03/14/23 @ 3:00PM with Dwain Sarna  Will route to Dr. Edward Jolly and close encounter.

## 2023-02-02 ENCOUNTER — Other Ambulatory Visit: Payer: BC Managed Care – PPO

## 2023-02-07 DIAGNOSIS — L814 Other melanin hyperpigmentation: Secondary | ICD-10-CM | POA: Diagnosis not present

## 2023-02-07 DIAGNOSIS — L538 Other specified erythematous conditions: Secondary | ICD-10-CM | POA: Diagnosis not present

## 2023-02-07 DIAGNOSIS — L821 Other seborrheic keratosis: Secondary | ICD-10-CM | POA: Diagnosis not present

## 2023-02-07 DIAGNOSIS — B078 Other viral warts: Secondary | ICD-10-CM | POA: Diagnosis not present

## 2023-02-07 DIAGNOSIS — R238 Other skin changes: Secondary | ICD-10-CM | POA: Diagnosis not present

## 2023-02-07 DIAGNOSIS — D225 Melanocytic nevi of trunk: Secondary | ICD-10-CM | POA: Diagnosis not present

## 2023-02-07 DIAGNOSIS — Z789 Other specified health status: Secondary | ICD-10-CM | POA: Diagnosis not present

## 2023-03-28 DIAGNOSIS — N6324 Unspecified lump in the left breast, lower inner quadrant: Secondary | ICD-10-CM | POA: Diagnosis not present

## 2023-03-28 DIAGNOSIS — Z803 Family history of malignant neoplasm of breast: Secondary | ICD-10-CM | POA: Diagnosis not present

## 2023-03-31 ENCOUNTER — Telehealth: Payer: Self-pay | Admitting: Family Medicine

## 2023-03-31 NOTE — Telephone Encounter (Signed)
Patient is aware of upcoming appointment with Genetic Counselor

## 2023-04-12 ENCOUNTER — Inpatient Hospital Stay: Payer: BC Managed Care – PPO

## 2023-04-27 DIAGNOSIS — Z0184 Encounter for antibody response examination: Secondary | ICD-10-CM | POA: Diagnosis not present

## 2023-05-05 DIAGNOSIS — Z23 Encounter for immunization: Secondary | ICD-10-CM | POA: Diagnosis not present

## 2023-06-06 ENCOUNTER — Telehealth: Payer: Self-pay | Admitting: Genetic Counselor

## 2023-06-06 ENCOUNTER — Inpatient Hospital Stay: Payer: BC Managed Care – PPO

## 2023-06-06 ENCOUNTER — Inpatient Hospital Stay: Payer: BC Managed Care – PPO | Admitting: Genetic Counselor

## 2023-06-06 NOTE — Telephone Encounter (Signed)
Patient's friend, Debarah Crape, called to request genetics appt be cancelled for patient on 10/7 as Shenice is out of town.  Sent message to scheduling team to reach out to patient to see if she would like to reschedule.

## 2023-08-01 ENCOUNTER — Encounter: Payer: BC Managed Care – PPO | Admitting: Genetic Counselor

## 2023-08-01 ENCOUNTER — Other Ambulatory Visit: Payer: BC Managed Care – PPO
